# Patient Record
Sex: Female | Born: 1970
Health system: Southern US, Community
[De-identification: ages and names within clinical notes are randomized; demographics above are authoritative.]

## PROBLEM LIST (undated history)

## (undated) DIAGNOSIS — M7732 Calcaneal spur, left foot: Secondary | ICD-10-CM

## (undated) DIAGNOSIS — E7800 Pure hypercholesterolemia, unspecified: Secondary | ICD-10-CM

## (undated) DIAGNOSIS — M722 Plantar fascial fibromatosis: Secondary | ICD-10-CM

## (undated) DIAGNOSIS — M810 Age-related osteoporosis without current pathological fracture: Secondary | ICD-10-CM

## (undated) DIAGNOSIS — F259 Schizoaffective disorder, unspecified: Secondary | ICD-10-CM

## (undated) DIAGNOSIS — R739 Hyperglycemia, unspecified: Secondary | ICD-10-CM

## (undated) DIAGNOSIS — K760 Fatty (change of) liver, not elsewhere classified: Secondary | ICD-10-CM

## (undated) HISTORY — DX: Hyperglycemia, unspecified: R73.9

## (undated) HISTORY — DX: Calcaneal spur, left foot: M77.32

## (undated) HISTORY — DX: Fatty (change of) liver, not elsewhere classified: K76.0

## (undated) HISTORY — DX: Pure hypercholesterolemia, unspecified: E78.00

## (undated) HISTORY — DX: Plantar fascial fibromatosis: M72.2

## (undated) HISTORY — DX: Schizoaffective disorder, unspecified: F25.9

## (undated) HISTORY — PX: CHOLECYSTECTOMY: SHX55

---

## 2001-08-23 ENCOUNTER — Ambulatory Visit (HOSPITAL_COMMUNITY): Admission: RE | Admit: 2001-08-23 | Discharge: 2001-08-23 | Payer: Self-pay | Admitting: Family Medicine

## 2001-08-23 ENCOUNTER — Encounter: Payer: Self-pay | Admitting: Family Medicine

## 2003-11-15 ENCOUNTER — Ambulatory Visit (HOSPITAL_COMMUNITY): Admission: RE | Admit: 2003-11-15 | Discharge: 2003-11-15 | Payer: Self-pay | Admitting: Pulmonary Disease

## 2003-11-20 ENCOUNTER — Ambulatory Visit (HOSPITAL_COMMUNITY): Admission: RE | Admit: 2003-11-20 | Discharge: 2003-11-20 | Payer: Self-pay | Admitting: Pulmonary Disease

## 2003-11-30 ENCOUNTER — Ambulatory Visit (HOSPITAL_COMMUNITY): Admission: RE | Admit: 2003-11-30 | Discharge: 2003-11-30 | Payer: Self-pay | Admitting: General Surgery

## 2004-01-29 ENCOUNTER — Inpatient Hospital Stay (HOSPITAL_COMMUNITY): Admission: RE | Admit: 2004-01-29 | Discharge: 2004-02-07 | Payer: Self-pay | Admitting: Psychiatry

## 2004-10-27 ENCOUNTER — Emergency Department (HOSPITAL_COMMUNITY): Admission: EM | Admit: 2004-10-27 | Discharge: 2004-10-27 | Payer: Self-pay | Admitting: Emergency Medicine

## 2008-10-02 ENCOUNTER — Ambulatory Visit (HOSPITAL_COMMUNITY): Admission: RE | Admit: 2008-10-02 | Discharge: 2008-10-02 | Payer: Self-pay | Admitting: Obstetrics and Gynecology

## 2010-11-28 NOTE — Discharge Summary (Signed)
NAME:  Paula Oconnor, Paula Oconnor NO.:  [id number]   MEDICAL RECORD NO.:  [id number]                   PATIENT TYPE:  IPS   LOCATION:  0400                                 FACILITY:  [facility]   PHYSICIAN:  Geoffery Lyons, M.D.                   DATE OF BIRTH:  08-Sep-1970   DATE OF ADMISSION:  01/29/2004  DATE OF DISCHARGE:  02/07/2004                                 DISCHARGE SUMMARY   CHIEF COMPLAINT AND PRESENT ILLNESS:  This was the first admission to [facility] for this 40 year old married white female voluntarily  admitted.  Seen by Dr. Milford Cage for agitation and delusional thinking.  The patient believes her husband was having an affair with his ex-wife.  Has  been throwing things out into the yard, drove the husband's jeep to a pond,  making statement about husband not a true Christian, agitated and  uncooperative during the admission.  Uncooperative, easily agitated, feeling  that the Shaune Pollack was telling her that she was being persecuted.   PAST PSYCHIATRIC HISTORY:  Dr. Milford Cage.  First time inpatient.   ALCOHOL/DRUG HISTORY:  Denies the use or abuse of any substances.   PAST MEDICAL HISTORY:  Chronic cystitis, status post cholecystectomy.   MEDICATIONS:  Risperdal 1.5 mg at night (has not been taking).   PHYSICAL EXAMINATION:  Performed and failed to show any acute findings.   LABORATORY DATA:  She refused to have her blood drawn.   MENTAL STATUS EXAM:  Alert female pacing in the hall with head in her hands.  Directible with encouragement but easily agitated.  Speech some pressure.  Affect anxious, agitated.  Thought process insistent on the husband is the  problem.  Some paranoid ideation and possibly some auditory hallucinations.  Cognition well-preserved.   ADMISSION DIAGNOSES:   AXIS I:  Bipolar disorder, manic with psychotic features.   AXIS II:  No diagnosis.   AXIS III:  No diagnosis.   AXIS IV:  Moderate.   AXIS  V:  Global Assessment of Functioning upon admission 25-30; highest  Global Assessment of Functioning in the last year 65-70.   HOSPITAL COURSE:  She was admitted and started in individual and group  psychotherapy.  She, upon admission, had to be restrained.  She became very  agitated.  When she signed voluntarily, she did not want to come in.  Claiming that she was afraid for herself.  She was given Risperdal M-Tab 1  mg upon admission and M-Tab 1 mg every four hours as needed and she was  given Ativan and Cogentin.  She required Zyprexa 5 mg IM and she was given  Ambien for sleep.  We continued to work with the Risperdal.  Initially  resistant to take it.  Later on, she was compliant.  She was actively  delusional.  York Spaniel that she was placed in the hospital to get the  demons out  of her.  Stated if I don't leave, you will regret it.  Easily agitated,  not responding to attempts to bring her back to reality, demanding to be  discharged and stating that the husband was the problem.  Continued to  evidence the paranoid delusional ideas, hyperreligiousness, delusions of  being possessed, having to be cleaned by being in the hospital, the process  having been interrupted and she has to start the process all over again.  She was able to pull herself together and present herself healthier, still  claiming that the husband was having an affair.  She would not respond to  attempts to bring her back to reality with evidence that he probably was  not.  She continued to take the medication.  We went ahead and continued to  increase the Risperdal as tolerated, Risperdal M-Tab 0.5 mg in the morning  and 2.5 mg at night.  Eventually, we went up to Risperdal M-Tab 3 mg at  bedtime.  After being very agitated, very irritable and obviously anxious,  she became more subdued, withdrawn, staying in her room.  There was a family  session where the husband was trying to clarify for her and he was pretty   emotional and tearful.  She started questioning the validity of her thoughts  in terms of having an affair, so there was a turn towards her getting  better.  On February 07, 2004, she was indeed better.  There was some underlying  delusional ideas regarding the husband but she was not acting out on them  and she was also willing to give the husband the benefit of the doubt.  She  was more receptive to talk to him.  She was going to stay with her parents.  Her parents had no concerns about her being discharged or the way she was  presenting herself.  She had been compliant with medication.  There were no  suicidal ideation, no homicidal ideation and overall there was improvement.  There was no side effects to the medication and we went ahead and discharged  to outpatient follow-up.   DISCHARGE DIAGNOSES:   AXIS I:  Bipolar disorder, manic with psychotic features.   AXIS II:  No diagnosis.   AXIS III:  No diagnosis.   AXIS IV:  Moderate.   AXIS V:  Global Assessment of Functioning upon discharge 50-55.   DISCHARGE MEDICATIONS:  Risperdal M-Tab 0.5 mg in the morning and Risperdal  M-Tab 3 mg at night.   FOLLOW UP:  Dr. Milford Cage at [facility].                                               Geoffery Lyons, M.D.    IL/MEDQ  D:  03/12/2004  T:  03/13/2004  Job:  [id number]

## 2010-11-28 NOTE — Op Note (Signed)
NAME:  DAKISHA, SCHOOF                       ACCOUNT NO.:  [id number]   MEDICAL RECORD NO.:  [id number]                   PATIENT TYPE:  AMB   LOCATION:  DAY                                  FACILITY:  [facility]   PHYSICIAN:  Dalia Heading, M.D.               DATE OF BIRTH:  1970-11-16   DATE OF PROCEDURE:  DATE OF DISCHARGE:                                 OPERATIVE REPORT   PREOPERATIVE DIAGNOSIS:  Chronic cholecystitis.   POSTOPERATIVE DIAGNOSIS:  Chronic cholecystitis.   PROCEDURE:  Laparoscopic cholecystectomy   SURGEON:  Dalia Heading, M.D.   ASSISTANT:  Buena Irish, M.D.   ANESTHESIA:  General endotracheal.   INDICATIONS:  The patient is a 40 year old white female who is referred for  treatment of chronic cholecystitis.  The risks and benefits of the procedure  including bleeding, infection, hepatobiliary injury, and the possibility of  an open procedure were fully explained to the patient, who gave informed  consent.   DESCRIPTION OF PROCEDURE:  The patient was placed in the supine position.  After induction of general endotracheal anesthesia, the abdomen was prepped  and draped using the usual sterile technique with Betadine.  Surgical site  confirmation was performed.   An infraumbilical incision was made down to the fascia.  A Veress needle was  introduced into the abdominal cavity and confirmation of placement was done  using the saline drop test.  The abdomen was then insufflated to 16 mmHg  pressure.  An 11-mm trocar was introduced into the abdominal cavity under  direct visualization without difficulty.  The patient was placed in reverse  Trendelenburg position and an additional 11-mm trocar was placed in the  epigastric region and 5-mm trocars were placed in the right upper quadrant  and right flank regions. The liver was inspected and noted to be within  normal limits.   The gallbladder was retracted superiorly and laterally.  The dissection was  begun around the infundibulum of the gallbladder.  The cystic duct was first  identified.  Its juncture to the infundibulum fully identified.  Endoclips  were placed proximally and distally on the cystic duct; and the cystic duct  was divided.  This was likewise done on the cystic artery.  The gallbladder  was then freed away from the gallbladder fossa using Bovie electrocautery.  The gallbladder was delivered through the epigastric trocar site without  difficulty.  The gallbladder fossa was inspected and no abnormal bleeding or  bile leakage was noted.  All fluid and air were then evacuated from the  abdominal cavity prior to removal of the trocars.   All wounds were irrigated with normal saline.  All wounds were injected with  0.5% Sensorcaine.  The infraumbilical fascia as well as epigastric fascia  were reapproximated using an #0 Vicryl interrupted suture. All skin  incisions were closed using staples.  Betadine ointment and dry sterile  dressings were applied.  All tape and needle counts correct at the end of the procedure.  The patient  was extubated in the operating room and went back to recovery room in awake  and stable condition.   COMPLICATIONS:  None.   SPECIMENS:  Gallbladder.   BLOOD LOSS:  Minimal.      ___________________________________________                                            Dalia Heading, M.D.   MAJ/MEDQ  D:  11/30/2003  T:  11/30/2003  Job:  [id number]   cc:   Dalia Heading, M.D.  [address], [place]  [place] [address]  Fax: [phone]   Oneal Deputy. Juanetta Gosling, M.D.  [address]  [place]  [place] [address]  Fax: [phone]

## 2010-11-28 NOTE — H&P (Signed)
NAME:  Paula Oconnor, Paula Oconnor NO.:  [id number]   MEDICAL RECORD NO.:  [id number]                  PATIENT TYPE:   LOCATION:                                       FACILITY:  [facility]   PHYSICIAN:  Dalia Heading, M.D.               DATE OF BIRTH:  May 15, 1971   DATE OF ADMISSION:  DATE OF DISCHARGE:                                HISTORY & PHYSICAL   CHIEF COMPLAINT:  Chronic cholecystitis.   HISTORY OF PRESENT ILLNESS:  Patient is a 40 year old white female who is  referred for evaluation and treatment of biliary colic secondary to chronic  cholecystitis.  She has been having intermittent episodes of right upper  quadrant abdominal pain with radiation to the right flank, nausea, and  bloating for several weeks.  Some fatty food intolerance is noted.  No  fever, chills, or jaundice have been noted.  The symptoms have been  worsening.   PAST MEDICAL HISTORY:  For the most part, unremarkable.   PAST SURGICAL HISTORY:  Unremarkable.   CURRENT MEDICATIONS:  Risperdal 0.5 mg p.o. q.d.   ALLERGIES:  CODEINE.   REVIEW OF SYSTEMS:  Patient does smoke less than a pack of cigarettes a day.  She drinks alcohol on occasion.  She denies any other cardiopulmonary  difficulties or bleeding disorders.   PHYSICAL EXAMINATION:  VITAL SIGNS:  She is afebrile.  Vital signs are  stable.  GENERAL:  Patient is a well-developed and well-nourished white female in no  acute distress.  HEENT:  No scleral icterus.  LUNGS:  Clear to auscultation with equal breath sounds bilaterally.  HEART:  Regular rate and rhythm without S3, S4, or murmurs.  ABDOMEN:  Soft with slight tenderness in the right upper quadrant to  palpation.  No hepatosplenomegaly, masses, or hernias are identified.   Ultrasound of the gallbladder is negative.  Hepatobiliary scan reveals  chronic cholecystitis with a low gallbladder ejection fraction and  reproducible symptoms with a fatty meal.   IMPRESSION:   Chronic cholecystitis.   PLAN:  The patient is scheduled for a laparoscopic cholecystectomy on Nov 30, 2003.  The risks and benefits of the procedure, including bleeding,  infection, hepatobiliary injury, and the possibility of an open procedure  were fully explained to the patient, who gave informed consent.     ___________________________________________                                         Dalia Heading, M.D.   MAJ/MEDQ  D:  11/27/2003  T:  11/27/2003  Job:  [id number]   cc:   Ramon Dredge L. Juanetta Gosling, M.D.  [address]  [place]  [place] [address]  Fax: [phone]

## 2010-12-09 ENCOUNTER — Other Ambulatory Visit (HOSPITAL_COMMUNITY): Payer: Self-pay | Admitting: Pulmonary Disease

## 2010-12-09 DIAGNOSIS — R0981 Nasal congestion: Secondary | ICD-10-CM

## 2010-12-11 ENCOUNTER — Ambulatory Visit (HOSPITAL_COMMUNITY)
Admission: RE | Admit: 2010-12-11 | Discharge: 2010-12-11 | Disposition: A | Discharge status: Home / Self Care | Payer: 59 | Source: Ambulatory Visit | Attending: Pulmonary Disease | Admitting: Pulmonary Disease

## 2010-12-11 DIAGNOSIS — J3489 Other specified disorders of nose and nasal sinuses: Secondary | ICD-10-CM | POA: Insufficient documentation

## 2010-12-11 DIAGNOSIS — R0981 Nasal congestion: Secondary | ICD-10-CM

## 2010-12-11 DIAGNOSIS — J329 Chronic sinusitis, unspecified: Secondary | ICD-10-CM | POA: Insufficient documentation

## 2011-02-08 ENCOUNTER — Emergency Department (HOSPITAL_COMMUNITY): Admission: EM | Admit: 2011-02-08 | Discharge: 2011-02-08 | Discharge status: Against Medical Advice | Payer: Self-pay

## 2016-07-14 DIAGNOSIS — Z79899 Other long term (current) drug therapy: Secondary | ICD-10-CM | POA: Diagnosis not present

## 2016-08-13 DIAGNOSIS — Z79899 Other long term (current) drug therapy: Secondary | ICD-10-CM | POA: Diagnosis not present

## 2016-09-14 DIAGNOSIS — Z79899 Other long term (current) drug therapy: Secondary | ICD-10-CM | POA: Diagnosis not present

## 2016-10-13 DIAGNOSIS — Z79899 Other long term (current) drug therapy: Secondary | ICD-10-CM | POA: Diagnosis not present

## 2016-10-29 DIAGNOSIS — I259 Chronic ischemic heart disease, unspecified: Secondary | ICD-10-CM | POA: Diagnosis not present

## 2016-10-29 DIAGNOSIS — R739 Hyperglycemia, unspecified: Secondary | ICD-10-CM | POA: Diagnosis not present

## 2016-11-10 DIAGNOSIS — R739 Hyperglycemia, unspecified: Secondary | ICD-10-CM | POA: Diagnosis not present

## 2016-11-10 DIAGNOSIS — Z79899 Other long term (current) drug therapy: Secondary | ICD-10-CM | POA: Diagnosis not present

## 2016-12-09 DIAGNOSIS — Z79899 Other long term (current) drug therapy: Secondary | ICD-10-CM | POA: Diagnosis not present

## 2017-01-04 DIAGNOSIS — Z6822 Body mass index (BMI) 22.0-22.9, adult: Secondary | ICD-10-CM | POA: Diagnosis not present

## 2017-01-04 DIAGNOSIS — Z01419 Encounter for gynecological examination (general) (routine) without abnormal findings: Secondary | ICD-10-CM | POA: Diagnosis not present

## 2017-01-08 DIAGNOSIS — Z79899 Other long term (current) drug therapy: Secondary | ICD-10-CM | POA: Diagnosis not present

## 2017-02-09 DIAGNOSIS — Z79899 Other long term (current) drug therapy: Secondary | ICD-10-CM | POA: Diagnosis not present

## 2017-03-04 DIAGNOSIS — L821 Other seborrheic keratosis: Secondary | ICD-10-CM | POA: Diagnosis not present

## 2017-03-04 DIAGNOSIS — Z1283 Encounter for screening for malignant neoplasm of skin: Secondary | ICD-10-CM | POA: Diagnosis not present

## 2017-03-08 DIAGNOSIS — Z79899 Other long term (current) drug therapy: Secondary | ICD-10-CM | POA: Diagnosis not present

## 2017-04-06 DIAGNOSIS — Z79899 Other long term (current) drug therapy: Secondary | ICD-10-CM | POA: Diagnosis not present

## 2017-05-03 DIAGNOSIS — Z79899 Other long term (current) drug therapy: Secondary | ICD-10-CM | POA: Diagnosis not present

## 2017-06-04 DIAGNOSIS — Z79899 Other long term (current) drug therapy: Secondary | ICD-10-CM | POA: Diagnosis not present

## 2017-06-30 DIAGNOSIS — Z79899 Other long term (current) drug therapy: Secondary | ICD-10-CM | POA: Diagnosis not present

## 2017-07-26 DIAGNOSIS — Z79899 Other long term (current) drug therapy: Secondary | ICD-10-CM | POA: Diagnosis not present

## 2017-08-30 DIAGNOSIS — Z79899 Other long term (current) drug therapy: Secondary | ICD-10-CM | POA: Diagnosis not present

## 2017-09-24 DIAGNOSIS — Z79899 Other long term (current) drug therapy: Secondary | ICD-10-CM | POA: Diagnosis not present

## 2017-09-28 DIAGNOSIS — I259 Chronic ischemic heart disease, unspecified: Secondary | ICD-10-CM | POA: Diagnosis not present

## 2017-09-28 DIAGNOSIS — H6693 Otitis media, unspecified, bilateral: Secondary | ICD-10-CM | POA: Diagnosis not present

## 2017-10-25 DIAGNOSIS — Z79899 Other long term (current) drug therapy: Secondary | ICD-10-CM | POA: Diagnosis not present

## 2017-11-11 DIAGNOSIS — X32XXXA Exposure to sunlight, initial encounter: Secondary | ICD-10-CM | POA: Diagnosis not present

## 2017-11-11 DIAGNOSIS — L570 Actinic keratosis: Secondary | ICD-10-CM | POA: Diagnosis not present

## 2017-11-24 DIAGNOSIS — Z79899 Other long term (current) drug therapy: Secondary | ICD-10-CM | POA: Diagnosis not present

## 2017-12-22 DIAGNOSIS — Z79899 Other long term (current) drug therapy: Secondary | ICD-10-CM | POA: Diagnosis not present

## 2018-01-12 DIAGNOSIS — Z01419 Encounter for gynecological examination (general) (routine) without abnormal findings: Secondary | ICD-10-CM | POA: Diagnosis not present

## 2018-01-12 DIAGNOSIS — Z6821 Body mass index (BMI) 21.0-21.9, adult: Secondary | ICD-10-CM | POA: Diagnosis not present

## 2018-01-17 DIAGNOSIS — Z79899 Other long term (current) drug therapy: Secondary | ICD-10-CM | POA: Diagnosis not present

## 2018-02-14 DIAGNOSIS — Z79899 Other long term (current) drug therapy: Secondary | ICD-10-CM | POA: Diagnosis not present

## 2018-03-11 DIAGNOSIS — Z79899 Other long term (current) drug therapy: Secondary | ICD-10-CM | POA: Diagnosis not present

## 2018-04-11 DIAGNOSIS — Z79899 Other long term (current) drug therapy: Secondary | ICD-10-CM | POA: Diagnosis not present

## 2018-05-11 DIAGNOSIS — Z79899 Other long term (current) drug therapy: Secondary | ICD-10-CM | POA: Diagnosis not present

## 2018-06-03 DIAGNOSIS — Z79899 Other long term (current) drug therapy: Secondary | ICD-10-CM | POA: Diagnosis not present

## 2018-06-30 DIAGNOSIS — Z79899 Other long term (current) drug therapy: Secondary | ICD-10-CM | POA: Diagnosis not present

## 2018-07-27 DIAGNOSIS — Z79899 Other long term (current) drug therapy: Secondary | ICD-10-CM | POA: Diagnosis not present

## 2018-08-22 DIAGNOSIS — Z79899 Other long term (current) drug therapy: Secondary | ICD-10-CM | POA: Diagnosis not present

## 2018-09-14 DIAGNOSIS — Z79899 Other long term (current) drug therapy: Secondary | ICD-10-CM | POA: Diagnosis not present

## 2018-10-06 DIAGNOSIS — D649 Anemia, unspecified: Secondary | ICD-10-CM | POA: Diagnosis not present

## 2018-10-06 DIAGNOSIS — R739 Hyperglycemia, unspecified: Secondary | ICD-10-CM | POA: Diagnosis not present

## 2018-10-11 DIAGNOSIS — R739 Hyperglycemia, unspecified: Secondary | ICD-10-CM | POA: Diagnosis not present

## 2018-10-11 DIAGNOSIS — Z79899 Other long term (current) drug therapy: Secondary | ICD-10-CM | POA: Diagnosis not present

## 2018-10-11 LAB — HEMOGLOBIN A1C: Hemoglobin A1C: 5.1

## 2018-10-11 LAB — CBC AND DIFFERENTIAL
Hemoglobin: 13.2 (ref 12.0–16.0)
WBC: 10.5

## 2018-11-09 DIAGNOSIS — I10 Essential (primary) hypertension: Secondary | ICD-10-CM | POA: Diagnosis not present

## 2018-11-21 ENCOUNTER — Encounter: Payer: Self-pay | Admitting: "Endocrinology

## 2018-11-21 ENCOUNTER — Encounter (INDEPENDENT_AMBULATORY_CARE_PROVIDER_SITE_OTHER): Payer: Self-pay

## 2018-11-21 ENCOUNTER — Ambulatory Visit (INDEPENDENT_AMBULATORY_CARE_PROVIDER_SITE_OTHER): Payer: 59 | Admitting: "Endocrinology

## 2018-11-21 ENCOUNTER — Other Ambulatory Visit: Payer: Self-pay

## 2018-11-21 VITALS — BP 127/83 | HR 116 | Ht 62.5 in | Wt 138.0 lb

## 2018-11-21 DIAGNOSIS — R635 Abnormal weight gain: Secondary | ICD-10-CM | POA: Diagnosis not present

## 2018-11-21 DIAGNOSIS — E349 Endocrine disorder, unspecified: Secondary | ICD-10-CM | POA: Diagnosis not present

## 2018-11-21 DIAGNOSIS — R7302 Impaired glucose tolerance (oral): Secondary | ICD-10-CM | POA: Insufficient documentation

## 2018-11-21 NOTE — Progress Notes (Signed)
Endocrinology Consult Note                                            11/21/2018, 5:38 PM   Subjective:    Patient ID: Paula Oconnor, female    DOB: [birth year], PCP Kari Baars, MD   Past Medical History:  Diagnosis Date  . Hyperglycemia   . Schizoaffective disorder [facility])    Past Surgical History:  Procedure Laterality Date  . CHOLECYSTECTOMY     Social History   Socioeconomic History  . Marital status: Married    Spouse name: Not on file  . Number of children: Not on file  . Years of education: Not on file  . Highest education level: Not on file  Occupational History  . Not on file  Social Needs  . Financial resource strain: Not on file  . Food insecurity:    Worry: Not on file    Inability: Not on file  . Transportation needs:    Medical: Not on file    Non-medical: Not on file  Tobacco Use  . Smoking status: Former Games developer  . Smokeless tobacco: Never Used  Substance and Sexual Activity  . Alcohol use: Never    Frequency: Never  . Drug use: Never  . Sexual activity: Not on file  Lifestyle  . Physical activity:    Days per week: Not on file    Minutes per session: Not on file  . Stress: Not on file  Relationships  . Social connections:    Talks on phone: Not on file    Gets together: Not on file    Attends religious service: Not on file    Active member of club or organization: Not on file    Attends meetings of clubs or organizations: Not on file    Relationship status: Not on file  Other Topics Concern  . Not on file  Social History Narrative  . Not on file   History reviewed. No pertinent family history. Outpatient Encounter Medications as of 11/21/2018  Medication Sig  . Multiple Vitamin (MULTIVITAMIN) tablet Take 1 tablet by mouth daily.  . risperiDONE (RISPERDAL) 0.5 MG tablet Take 0.5 mg by mouth at bedtime.  . clonazepam (KLONOPIN WAFER) 0.125 MG disintegrating tablet Take 0.125 mg by mouth 2 (two) times daily.    .  cloZAPine (CLOZARIL) 100 MG tablet Take 600 mg by mouth 3 (three) times daily. Take 1 tab in the morning, 1 in the afternoon, and 5 before bedtime.   . ferrous sulfate (IRON SUPPLEMENT) 325 (65 FE) MG tablet Take 325 mg by mouth daily with breakfast.    . [DISCONTINUED] docusate sodium (COLACE) 100 MG capsule Take 100 mg by mouth 2 (two) times daily.    . [DISCONTINUED] folic acid (FOLVITE) 1 MG tablet Take 2 mg by mouth 2 (two) times daily.    . [DISCONTINUED] ibuprofen (ADVIL,MOTRIN) 200 MG tablet Take 200 mg by mouth every 6 (six) hours as needed. Pain   . [DISCONTINUED] lamoTRIgine (LAMICTAL) 25 MG tablet Take 25 mg by mouth daily.    . [DISCONTINUED] topiramate (TOPAMAX) 100 MG tablet Take 100 mg by mouth daily.     No facility-administered encounter medications on file as of 11/21/2018.    ALLERGIES: Allergies  Allergen Reactions  . Codeine Other (See Comments)    Patient doesn't  remember what happened    VACCINATION STATUS:  There is no immunization history on file for this patient.  HPI Paula Oconnor is 48 y.o. female who presents today with a medical history as above. she is being seen in consultation for hyperglycemia requested by Kari BaarsHawkins, Edward, MD.   -Patient denies any prior history of diabetes nor prediabetes.  Her most recent A1c was 5.1%, 2 years prior it was 4.8%.  She has been measuring her glycemic profile intermittently and randomly. She denies to have blood glucose profile below 70 nor above 200.  However she reports that she feels shakes, palpitations, and diaphoresis intermittently which forces her to eat every 2-3 hours.  That is causing unintended weight gain.  Her weight is still near her ideal weight around 138 pounds with BMI of 24.  She is on a list of medications related to her mood disorder including clozapine, clonazepam, Klonopin, risperidone.  She has history of anemia in the past however her most recent CBC showed hemoglobin of 13.6.  She is adopted,  does not know any family history of diabetes.  She denies alcohol abuse, she denies any prior exposure to steroids.   Review of Systems  Constitutional: + recent weight gain, no fatigue, no subjective hyperthermia, no subjective hypothermia Eyes: no blurry vision, no xerophthalmia ENT: no sore throat, no nodules palpated in throat, no dysphagia/odynophagia, no hoarseness Cardiovascular: no Chest Pain, no Shortness of Breath, no palpitations, no leg swelling Respiratory: no cough, no shortness of breath Gastrointestinal: no Nausea/Vomiting/Diarhhea Musculoskeletal: no muscle/joint aches Skin: no rashes Neurological: + tremors, no numbness, no tingling, + dizziness Psychiatric: + depression, + anxiety  Objective:    BP 127/83   Pulse (!) 116   Ht 5' 2.5" (1.588 m)   Wt 138 lb (62.6 kg)   BMI 24.84 kg/m   Wt Readings from Last 3 Encounters:  11/21/18 138 lb (62.6 kg)    Physical Exam  Constitutional:  + Appropriate weight for height, not in acute distress, normal state of mind Eyes: PERRLA, EOMI, no exophthalmos ENT: moist mucous membranes, no gross thyromegaly, no gross cervical lymphadenopathy Cardiovascular: normal precordial activity, Regular Rate and Rhythm, no Murmur/Rubs/Gallops Respiratory:  adequate breathing efforts, no gross chest deformity, Clear to auscultation bilaterally Gastrointestinal: abdomen soft, Non -tender, No distension, Bowel Sounds present, no gross organomegaly Musculoskeletal: no gross deformities, strength intact in all four extremities Skin: moist, warm, no rashes Neurological: no tremor with outstretched hands, Deep tendon reflexes normal in bilateral lower extremities.    Assessment & Plan:   1. Endocrine disorder, unspecified 2. Weight gain  - Paula Oconnor  is being seen at a kind request of Kari BaarsHawkins, Edward, MD. - I have reviewed her available endocrine records and clinically evaluated the patient.  Based on these reviews, patient does  not have diabetes nor prediabetes.  She presents with symptoms suggestive of reactive hypoglycemia.  The possibility of hyperglycemia associated with risperidone is discussed with her.  However this would not be clinically significant in her case and I did not suggest for her to discontinue this medication.  She will greatly benefit from dietary modification which I discussed in detail with her.  - Patient admits there is a room for improvement in her diet and drink choices. -  Suggestion is made for her to avoid simple carbohydrates  from her diet including Cakes, Sweet Desserts / Pastries, Ice Cream, Soda (diet and regular), Sweet Tea, Candies, Chips, Cookies, Store Bought Juices, Alcohol in Excess  of  1-2 drinks a day, Artificial Sweeteners, and "Sugar-free" Products. This will help patient to have stable blood glucose profile and potentially avoid unintended weight gain.  -She will benefit from work-up for adrenal insufficiency, thyroid sufficiency, vitamin D level measurement.  She is approached to monitor blood glucose when she feels symptoms including tremors, diaphoresis, headaches before she intervenes. She is encouraged to call clinic if she registers less than 70 mg/dl  Or greater than 004 mg per DL x3 in a week.  -Based on her presentation so far, there is not sufficient information to proceed with definitive treatment plan.   - I did not initiate any new prescriptions today.  Her major manifestation seems to be her schizoaffective disorder on polypharmacy, may benefit from evaluation by a psychiatrist.  - I advised her  to maintain close follow up with Kari Baars, MD for primary care needs.   - Time spent with the patient: 45 minutes, of which >50% was spent in obtaining information about her symptoms, reviewing her previous labs/studies,  evaluations, and treatments, counseling her about her hyperglycemia, weight gain,, and developing a plan to confirm the diagnosis and long term  treatment based on the latest standards of care/guidelines.    Paula Oconnor participated in the discussions, expressed understanding, and voiced agreement with the above plans.  All questions were answered to her satisfaction. she is encouraged to contact clinic should she have any questions or concerns prior to her return visit.  Follow up plan: Return in about 10 days (around 12/01/2018) for Follow up with Pre-visit Labs.   Marquis Lunch, MD [facility] [address] [place], [place] [address] Phone: [phone]  Fax: [phone]     11/21/2018, 5:38 PM  This note was partially dictated with voice recognition software. Similar sounding words can be transcribed inadequately or may not  be corrected upon review.

## 2018-11-25 DIAGNOSIS — R635 Abnormal weight gain: Secondary | ICD-10-CM | POA: Diagnosis not present

## 2018-11-25 DIAGNOSIS — E349 Endocrine disorder, unspecified: Secondary | ICD-10-CM | POA: Diagnosis not present

## 2018-11-28 LAB — CORTISOL-AM, BLOOD: Cortisol - AM: 21.1 ug/dL

## 2018-11-28 LAB — VITAMIN D 25 HYDROXY (VIT D DEFICIENCY, FRACTURES): Vit D, 25-Hydroxy: 20 ng/mL — ABNORMAL LOW (ref 30–100)

## 2018-11-28 LAB — T3, FREE: T3, Free: 2.5 pg/mL (ref 2.3–4.2)

## 2018-11-28 LAB — T4, FREE: Free T4: 1.1 ng/dL (ref 0.8–1.8)

## 2018-11-28 LAB — TSH: TSH: 2.27 mIU/L

## 2018-11-30 ENCOUNTER — Other Ambulatory Visit: Payer: Self-pay

## 2018-11-30 ENCOUNTER — Ambulatory Visit (INDEPENDENT_AMBULATORY_CARE_PROVIDER_SITE_OTHER): Payer: 59 | Admitting: "Endocrinology

## 2018-11-30 ENCOUNTER — Encounter: Payer: Self-pay | Admitting: "Endocrinology

## 2018-11-30 DIAGNOSIS — R739 Hyperglycemia, unspecified: Secondary | ICD-10-CM | POA: Diagnosis not present

## 2018-11-30 DIAGNOSIS — E559 Vitamin D deficiency, unspecified: Secondary | ICD-10-CM

## 2018-11-30 DIAGNOSIS — R635 Abnormal weight gain: Secondary | ICD-10-CM

## 2018-11-30 MED ORDER — VITAMIN D3 125 MCG (5000 UT) PO CAPS
5000.0000 [IU] | ORAL_CAPSULE | Freq: Every day | ORAL | 0 refills | Status: DC
Start: 2018-11-30 — End: 2021-02-26

## 2018-11-30 NOTE — Progress Notes (Signed)
11/30/2018, 5:36 PM                                Endocrinology Telehealth Visit Follow up Note -During COVID -19 Pandemic  I connected with Paula Oconnor on 11/30/2018   by telephone and verified that I am speaking with the correct person using two identifiers. Paula Oconnor, 25-Mar-1971. she has verbally consented to this visit. All issues noted in this document were discussed and addressed. The format was not optimal for physical exam.     Subjective:    Patient ID: Paula Oconnor, female    DOB: Feb 28, 1971, PCP Kari Baars, MD   Past Medical History:  Diagnosis Date  . Hyperglycemia   . Schizoaffective disorder [facility])    Past Surgical History:  Procedure Laterality Date  . CHOLECYSTECTOMY     Social History   Socioeconomic History  . Marital status: Married    Spouse name: Not on file  . Number of children: Not on file  . Years of education: Not on file  . Highest education level: Not on file  Occupational History  . Not on file  Social Needs  . Financial resource strain: Not on file  . Food insecurity:    Worry: Not on file    Inability: Not on file  . Transportation needs:    Medical: Not on file    Non-medical: Not on file  Tobacco Use  . Smoking status: Former Games developer  . Smokeless tobacco: Never Used  Substance and Sexual Activity  . Alcohol use: Never    Frequency: Never  . Drug use: Never  . Sexual activity: Not on file  Lifestyle  . Physical activity:    Days per week: Not on file    Minutes per session: Not on file  . Stress: Not on file  Relationships  . Social connections:    Talks on phone: Not on file    Gets together: Not on file    Attends religious service: Not on file    Active member of club or organization: Not on file    Attends meetings of clubs or organizations: Not on file    Relationship status: Not on file  Other Topics Concern  . Not on file  Social History  Narrative  . Not on file   History reviewed. No pertinent family history. Outpatient Encounter Medications as of 11/30/2018  Medication Sig  . Cholecalciferol (VITAMIN D3) 125 MCG (5000 UT) CAPS Take 1 capsule (5,000 Units total) by mouth daily.  . clonazepam (KLONOPIN WAFER) 0.125 MG disintegrating tablet Take 0.125 mg by mouth 2 (two) times daily.    . cloZAPine (CLOZARIL) 100 MG tablet Take 600 mg by mouth 3 (three) times daily. Take 1 tab in the morning, 1 in the afternoon, and 5 before bedtime.   . ferrous sulfate (IRON SUPPLEMENT) 325 (65 FE) MG tablet Take 325 mg by mouth daily with breakfast.    . Multiple Vitamin (MULTIVITAMIN) tablet Take 1 tablet by mouth daily.  . risperiDONE (RISPERDAL) 0.5 MG tablet Take 0.5 mg by mouth at bedtime.   No facility-administered encounter medications on file  as of 11/30/2018.    ALLERGIES: Allergies  Allergen Reactions  . Codeine Other (See Comments)    Patient doesn't remember what happened    VACCINATION STATUS:  There is no immunization history on file for this patient.  HPI Paula Oconnor is 48 y.o. female who was seen last visit in consultation for hyperglycemia   requested by Kari BaarsHawkins, Edward, MD.   -Patient denies any prior history of diabetes nor prediabetes.  Her most recent A1c was 5.1%, 2 years prior it was 4.8%.  She has been measuring her glycemic profile intermittently and randomly. She denies to have blood glucose profile below 70 nor above 200.  She has not made significant change in her diet, continues to have some shakes and palpitations intermittently which forces her to snack every 2-3 hours.  -Her previsit labs rule out adrenal insufficiency, thyroid dysfunction, confirm vitamin D deficiency.  -She has multiple complaints including weight gain, her weight is still near her ideal weight around 138 pounds with BMI of 24.  She is on a list of medications related to her mood disorder including clozapine, clonazepam,  Klonopin, risperidone.  She has history of anemia in the past however her most recent CBC showed hemoglobin of 13.6.  She is adopted, does not know any family history of diabetes.  She denies alcohol abuse, she denies any prior exposure to steroids.   Review of Systems Limited as above.    Objective:    There were no vitals taken for this visit.  Wt Readings from Last 3 Encounters:  11/21/18 138 lb (62.6 kg)    Physical Exam  Recent Results (from the past 2160 hour(s))  CBC and differential     Status: None   Collection Time: 10/11/18 12:00 AM  Result Value Ref Range   Hemoglobin 13.2 12.0 - 16.0   WBC 10.5   Hemoglobin A1c     Status: None   Collection Time: 10/11/18 12:00 AM  Result Value Ref Range   Hemoglobin A1C 5.1   Cortisol-am, blood     Status: None   Collection Time: 11/25/18  7:11 AM  Result Value Ref Range   Cortisol - AM 21.1 mcg/dL    Comment: Reference Range 8 a.m. (7-9 a.m.) Specimen: 4.0-22.0 .   TSH     Status: None   Collection Time: 11/25/18  7:11 AM  Result Value Ref Range   TSH 2.27 mIU/L    Comment:           Reference Range .           > or = 20 Years  0.40-4.50 .                Pregnancy Ranges           First trimester    0.26-2.66           Second trimester   0.55-2.73           Third trimester    0.43-2.91   T4, free     Status: None   Collection Time: 11/25/18  7:11 AM  Result Value Ref Range   Free T4 1.1 0.8 - 1.8 ng/dL  T3, free     Status: None   Collection Time: 11/25/18  7:11 AM  Result Value Ref Range   T3, Free 2.5 2.3 - 4.2 pg/mL  VITAMIN D 25 Hydroxy (Vit-D Deficiency, Fractures)     Status: Abnormal   Collection Time: 11/25/18  7:11 AM  Result Value Ref Range   Vit D, 25-Hydroxy 20 (L) 30 - 100 ng/mL    Comment: Vitamin D Status         25-OH Vitamin D: . Deficiency:                    <20 ng/mL Insufficiency:             20 - 29 ng/mL Optimal:                 > or = 30 ng/mL . For 25-OH Vitamin D testing on  patients on  D2-supplementation and patients for whom quantitation  of D2 and D3 fractions is required, the [facility]ure[facility](TM) 25-OH VIT D, (D2,D3), LC/MS/MS is recommended: order  code 40981 (patients >70yrs). See Note 1 . Note 1 . For additional information, please refer to  http://education.[facility].com/faq/FAQ199  (This link is being provided for informational/ educational purposes only.)       Assessment & Plan:   1. Endocrine disorder, unspecified 2. Weight gain  -She is reassured that her adrenal function is normal as well as her thyroid function.  She does not have diabetes nor prediabetes at this time.  She will be initiated on vitamin D supplements. -She appears to have random hyperglycemia related to her Risperdal, however she will continue to benefit from this medication and I do not suggest for her to discontinue this medication without discussion with her psychiatrist.  She will greatly benefit from dietary modification which I discussed in detail with her, is hesitant to change.  - Patient admits there is a room for improvement in her diet and drink choices. -  Suggestion is made for her to avoid simple carbohydrates  from her diet including Cakes, Sweet Desserts / Pastries, Ice Cream, Soda (diet and regular), Sweet Tea, Candies, Chips, Cookies, Store Bought Juices, Alcohol in Excess of  1-2 drinks a day, Artificial Sweeteners, and "Sugar-free" Products. This will help patient to have stable blood glucose profile and potentially avoid unintended weight gain. She is advised to continue to monitor blood glucose especially when she feels shakes and tremors before she intervenes and advised to call clinic her meter registers blood glucose readings less than 70 or greater than 2003.    - Her major manifestation seems to be her schizoaffective disorder on polypharmacy, may benefit from evaluation by a psychiatrist.  - I advised her  to maintain close follow up with  Kari Baars, MD for primary care needs.  Time for this visit 15 minutes. Paula Oconnor participated in the discussions, expressed understanding, and voiced agreement with the above plans.  All questions were answered to her satisfaction. she is encouraged to contact clinic should she have any questions or concerns prior to her return visit.  Follow up plan: Return in about 1 year (around 11/30/2019) for Follow up with Pre-visit Labs.   Marquis Lunch, MD [facility] [address] [place], [place] [address] Phone: [phone]  Fax: [phone]     11/30/2018, 5:36 PM  This note was partially dictated with voice recognition software. Similar sounding words can be transcribed inadequately or may not  be corrected upon review.

## 2018-12-21 ENCOUNTER — Ambulatory Visit: Payer: Self-pay | Admitting: "Endocrinology

## 2019-11-02 LAB — VITAMIN D 25 HYDROXY (VIT D DEFICIENCY, FRACTURES): Vit D, 25-Hydroxy: 41.8

## 2019-11-02 LAB — LIPID PANEL
Cholesterol: 203 — AB (ref 0–200)
HDL: 83 — AB (ref 35–70)
LDL Cholesterol: 107
Triglycerides: 72 (ref 40–160)

## 2019-11-02 LAB — TSH: TSH: 1.73 (ref 0.41–5.90)

## 2019-11-02 LAB — BASIC METABOLIC PANEL
BUN: 15 (ref 4–21)
Creatinine: 1.1 (ref 0.5–1.1)

## 2019-11-02 LAB — VITAMIN B12: Vitamin B-12: 1031

## 2019-11-30 ENCOUNTER — Other Ambulatory Visit: Payer: Self-pay

## 2019-11-30 ENCOUNTER — Ambulatory Visit (INDEPENDENT_AMBULATORY_CARE_PROVIDER_SITE_OTHER): Payer: 59 | Admitting: "Endocrinology

## 2019-11-30 ENCOUNTER — Encounter: Payer: Self-pay | Admitting: "Endocrinology

## 2019-11-30 VITALS — BP 111/76 | HR 102 | Ht 63.0 in | Wt 138.8 lb

## 2019-11-30 DIAGNOSIS — E559 Vitamin D deficiency, unspecified: Secondary | ICD-10-CM

## 2019-11-30 DIAGNOSIS — E782 Mixed hyperlipidemia: Secondary | ICD-10-CM | POA: Insufficient documentation

## 2019-11-30 NOTE — Progress Notes (Signed)
11/30/2019, 6:26 PM                   Endocrinology follow-up note  Subjective:    Patient ID: Paula Oconnor, female    DOB: [birth year], PCP Kari Baars, MD   Past Medical History:  Diagnosis Date  . Hyperglycemia   . Schizoaffective disorder [facility])    Past Surgical History:  Procedure Laterality Date  . CHOLECYSTECTOMY     Social History   Socioeconomic History  . Marital status: Married    Spouse name: Not on file  . Number of children: Not on file  . Years of education: Not on file  . Highest education level: Not on file  Occupational History  . Not on file  Tobacco Use  . Smoking status: Former Games developer  . Smokeless tobacco: Never Used  Substance and Sexual Activity  . Alcohol use: Never  . Drug use: Never  . Sexual activity: Not on file  Other Topics Concern  . Not on file  Social History Narrative  . Not on file   Social Determinants of Health   Financial Resource Strain:   . Difficulty of Paying Living Expenses:   Food Insecurity:   . Worried About Programme researcher, broadcasting/film/video in the Last Year:   . Barista in the Last Year:   Transportation Needs:   . Freight forwarder (Medical):   Marland Kitchen Lack of Transportation (Non-Medical):   Physical Activity:   . Days of Exercise per Week:   . Minutes of Exercise per Session:   Stress:   . Feeling of Stress :   Social Connections:   . Frequency of Communication with Friends and Family:   . Frequency of Social Gatherings with Friends and Family:   . Attends Religious Services:   . Active Member of Clubs or Organizations:   . Attends Banker Meetings:   Marland Kitchen Marital Status:    History reviewed. No pertinent family history. Outpatient Encounter Medications as of 11/30/2019  Medication Sig  . Cholecalciferol (VITAMIN D3) 125 MCG (5000 UT) CAPS Take 1 capsule (5,000 Units total) by mouth daily.  . citalopram (CELEXA) 20 MG tablet Take 20 mg by  mouth 2 (two) times daily.  . clonazepam (KLONOPIN WAFER) 0.125 MG disintegrating tablet Take 0.125 mg by mouth 2 (two) times daily.    . cloZAPine (CLOZARIL) 100 MG tablet Take 600 mg by mouth 3 (three) times daily. Take 1 tab in the morning, 1 in the afternoon, and 5 before bedtime.   . ferrous sulfate (IRON SUPPLEMENT) 325 (65 FE) MG tablet Take 325 mg by mouth daily with breakfast.    . Multiple Vitamin (MULTIVITAMIN) tablet Take 1 tablet by mouth daily.  . risperiDONE (RISPERDAL) 0.5 MG tablet Take 0.5 mg by mouth at bedtime.   No facility-administered encounter medications on file as of 11/30/2019.   ALLERGIES: Allergies  Allergen Reactions  . Codeine Other (See Comments)    Patient doesn't remember what happened    VACCINATION STATUS:  There is no immunization history on file for this patient.  HPI Paula Oconnor is 49 y.o. female who was seen last visit in consultation for hyperglycemia  requested by Kari Baars, MD.   -Patient denies any prior history of diabetes nor prediabetes.  Her recent A1c is the same as last visit A1c of 5.1%.  2 years prior was 4.8%.    She has been measuring her glycemic profile intermittently and randomly, no hyper or hypoglycemia. She denies to have blood glucose profile below 70 nor above 200.  She has no new complaints today.    -Her previsit labs ruled out adrenal insufficiency, thyroid dysfunction.  Her vitamin D level is replete now.  her weight is still near her ideal weight around 138 pounds with BMI of 24.  She is on a list of medications related to her mood disorder including clozapine, clonazepam, Klonopin, risperidone.  She has history of anemia in the past however her most recent CBC showed hemoglobin of 13.6.  She is adopted, does not know any family history of diabetes.  She denies alcohol abuse, she denies any prior exposure to steroids.   Review of Systems Limited as above.    Objective:    BP 111/76   Pulse (!) 102    Ht 5\' 3"  (1.6 m)   Wt 138 lb 12.8 oz (63 kg)   BMI 24.59 kg/m   Wt Readings from Last 3 Encounters:  11/30/19 138 lb 12.8 oz (63 kg)  11/21/18 138 lb (62.6 kg)    Physical Exam  Recent Results (from the past 2160 hour(s))  VITAMIN D 25 Hydroxy (Vit-D Deficiency, Fractures)     Status: None   Collection Time: 11/02/19 12:00 AM  Result Value Ref Range   Vit D, 25-Hydroxy 41.8   Basic metabolic panel     Status: None   Collection Time: 11/02/19 12:00 AM  Result Value Ref Range   BUN 15 4 - 21   Creatinine 1.1 0.5 - 1.1  Lipid panel     Status: Abnormal   Collection Time: 11/02/19 12:00 AM  Result Value Ref Range   Triglycerides 72 40 - 160   Cholesterol 203 (A) 0 - 200   HDL 83 (A) 35 - 70   LDL Cholesterol 107   Vitamin B12     Status: None   Collection Time: 11/02/19 12:00 AM  Result Value Ref Range   Vitamin B-12 1,031   TSH     Status: None   Collection Time: 11/02/19 12:00 AM  Result Value Ref Range   TSH 1.73 0.41 - 5.90      Assessment & Plan:   1.  Vitamin D deficiency--replete  2.  Dyslipidemia  -She is reassured that her adrenal function is normal as well as her thyroid function.  Confirmed by her repeat previsit labs, she did not have prediabetes/diabetes, thyroid dysfunction.   -She did not have any further glycemic excursions. -She previously had  random hyperglycemia related to her Risperdal, however she will continue to benefit from this medication and I do not suggest for her to discontinue this medication without discussion with her psychiatrist.  She will greatly benefit from dietary modification which I discussed in detail with her, is hesitant to change.  -Screening for celiac disease is negative.  - Patient admits there is a room for improvement in her diet and drink choices. -  Suggestion is made for her to avoid simple carbohydrates  from her diet including Cakes, Sweet Desserts / Pastries, Ice Cream, Soda (diet and regular), Sweet Tea,  Candies, Chips, Cookies, Store Bought Juices, Alcohol in Excess of  1-2 drinks a day,  Artificial Sweeteners, and "Sugar-free" Products. This will help patient to have stable blood glucose profile and potentially avoid unintended weight gain. She is advised to continue to monitor blood glucose especially when she feels shakes and tremors before she intervenes and advised to call clinic her meter registers blood glucose readings less than 70 or greater than 2003.    -She is advised on avoiding butter and fried food to help her dyslipidemia.  She will not need to start intervention at this time.  She is advised to continue her vitamin D supplements.  - Her major manifestation seems to be her schizoaffective disorder on polypharmacy, may benefit from evaluation by a psychiatrist.   - I advised her  to maintain close follow up with Sinda Du, MD for primary care needs.     - Time spent on this patient care encounter:  20 minutes of which 50% was spent in  counseling and the rest reviewing  her current and  previous labs / studies and medications  doses and developing a plan for long term care. Jilda Roche  participated in the discussions, expressed understanding, and voiced agreement with the above plans.  All questions were answered to her satisfaction. she is encouraged to contact clinic should she have any questions or concerns prior to her return visit.  Follow up plan: Return in about 6 months (around 06/01/2020) for F/U with Pre-visit Labs.   Glade Lloyd, MD [facility] [address] [place],  [address] Phone: [phone]  Fax: [phone]     11/30/2019, 6:26 PM  This note was partially dictated with voice recognition software. Similar sounding words can be transcribed inadequately or may not  be corrected upon review.

## 2020-02-14 ENCOUNTER — Other Ambulatory Visit: Payer: Self-pay

## 2020-02-14 ENCOUNTER — Ambulatory Visit (INDEPENDENT_AMBULATORY_CARE_PROVIDER_SITE_OTHER): Payer: 59

## 2020-02-14 ENCOUNTER — Ambulatory Visit
Admission: EM | Admit: 2020-02-14 | Discharge: 2020-02-14 | Disposition: A | Discharge status: Home / Self Care | Payer: Medicare Other | Attending: Emergency Medicine | Admitting: Emergency Medicine

## 2020-02-14 DIAGNOSIS — S99921A Unspecified injury of right foot, initial encounter: Secondary | ICD-10-CM

## 2020-02-14 DIAGNOSIS — M79671 Pain in right foot: Secondary | ICD-10-CM | POA: Diagnosis not present

## 2020-02-14 NOTE — ED Provider Notes (Signed)
[facility]   [id number] 02/14/20 Arrival Time: 1547   Chief Complaint  Patient presents with  . Fall     SUBJECTIVE: History from: patient.  Paula Oconnor is a 49 y.o. female who presented to the urgent care with a complaint of right foot pain and injury that occurred 2 to 3 hours ago.  Reports she fell from one step.  Localized pain to the right foot.  She describes the pain as constant and achy.  Has not tried any OTC medication.  Her symptoms are made worse with ROM.  She denies similar symptoms in the past.  Denies chills, fever, nausea, vomiting, diarrhea.  ROS: As per HPI.  All other pertinent ROS negative.     Past Medical History:  Diagnosis Date  . Hyperglycemia   . Schizoaffective disorder [facility])    Past Surgical History:  Procedure Laterality Date  . CHOLECYSTECTOMY     Allergies  Allergen Reactions  . Codeine Other (See Comments)    Patient doesn't remember what happened   No current facility-administered medications on file prior to encounter.   Current Outpatient Medications on File Prior to Encounter  Medication Sig Dispense Refill  . Cholecalciferol (VITAMIN D3) 125 MCG (5000 UT) CAPS Take 1 capsule (5,000 Units total) by mouth daily. 90 capsule 0  . citalopram (CELEXA) 20 MG tablet Take 20 mg by mouth 2 (two) times daily.    . clonazepam (KLONOPIN WAFER) 0.125 MG disintegrating tablet Take 0.125 mg by mouth 2 (two) times daily.      . cloZAPine (CLOZARIL) 100 MG tablet Take 600 mg by mouth 3 (three) times daily. Take 1 tab in the morning, 1 in the afternoon, and 5 before bedtime.     . ferrous sulfate (IRON SUPPLEMENT) 325 (65 FE) MG tablet Take 325 mg by mouth daily with breakfast.      . Multiple Vitamin (MULTIVITAMIN) tablet Take 1 tablet by mouth daily.    . risperiDONE (RISPERDAL) 0.5 MG tablet Take 0.5 mg by mouth at bedtime.     Social History   Socioeconomic History  . Marital status: Married    Spouse name: Not on file  .  Number of children: Not on file  . Years of education: Not on file  . Highest education level: Not on file  Occupational History  . Not on file  Tobacco Use  . Smoking status: Former Games developer  . Smokeless tobacco: Never Used  Substance and Sexual Activity  . Alcohol use: Never  . Drug use: Never  . Sexual activity: Not on file  Other Topics Concern  . Not on file  Social History Narrative  . Not on file   Social Determinants of Health   Financial Resource Strain:   . Difficulty of Paying Living Expenses:   Food Insecurity:   . Worried About Programme researcher, broadcasting/film/video in the Last Year:   . Barista in the Last Year:   Transportation Needs:   . Freight forwarder (Medical):   Marland Kitchen Lack of Transportation (Non-Medical):   Physical Activity:   . Days of Exercise per Week:   . Minutes of Exercise per Session:   Stress:   . Feeling of Stress :   Social Connections:   . Frequency of Communication with Friends and Family:   . Frequency of Social Gatherings with Friends and Family:   . Attends Religious Services:   . Active Member of Clubs or Organizations:   . Attends  Club or Organization Meetings:   Marland Kitchen Marital Status:   Intimate Partner Violence:   . Fear of Current or Ex-Partner:   . Emotionally Abused:   Marland Kitchen Physically Abused:   . Sexually Abused:    Family History  Family history unknown: Yes    OBJECTIVE:  Vitals:   02/14/20 1604  BP: 119/79  Pulse: (!) 102  Resp: 16  Temp: 97.7 F (36.5 C)  TempSrc: Oral  SpO2: 98%     Physical Exam Vitals and nursing note reviewed.  Constitutional:      General: She is not in acute distress.    Appearance: Normal appearance. She is normal weight. She is not ill-appearing, toxic-appearing or diaphoretic.  Cardiovascular:     Rate and Rhythm: Normal rate and regular rhythm.     Pulses: Normal pulses.     Heart sounds: Normal heart sounds. No murmur heard.  No friction rub. No gallop.   Pulmonary:     Effort: Pulmonary  effort is normal. No respiratory distress.     Breath sounds: Normal breath sounds. No stridor. No wheezing, rhonchi or rales.  Chest:     Chest wall: No tenderness.  Musculoskeletal:        General: Swelling and tenderness present.     Right foot: Swelling and tenderness present.     Left foot: Normal.     Comments: Patient is able to bear weight and ambulate with pain.  No surface trauma, ecchymosis, erythema, lesion, ulcer or break in skin integrity.  The right foot is with obvious deformity when compared to the left foot.  Swelling present.  Tenderness to midfoot.  Normal plantar dorsiflexion, inversion/eversion with pain.  Neurovascular status intact.  Neurological:     Mental Status: She is alert.     LABS:  DG FOOT COMPLETE RIGHT   DG Foot Complete Right  Result Date: 02/14/2020 CLINICAL DATA:  Twisting injury with lateral pain and bruising. EXAM: RIGHT FOOT COMPLETE - 3+ VIEW COMPARISON:  None. FINDINGS: There is no evidence of fracture or dislocation. There is no evidence of arthropathy or other focal bone abnormality. Soft tissues are unremarkable. IMPRESSION: Negative. Electronically Signed   By: Paulina Fusi M.D.   On: 02/14/2020 16:25    No results found for this or any previous visit (from the past 24 hour(s)).   ASSESSMENT & PLAN:  1. Right foot pain   2. Injury of right foot, initial encounter     No orders of the defined types were placed in this encounter.  Patient stable at discharge.  Right foot x-ray is negative for bony abnormality including fracture or dislocation.  I have reviewed the x-ray myself and the radiologist interpretation.  I am in agreement with the radiologist interpretation.  Take OTC Tylenol as needed for pain.  Follow RICE instruction.  Follow with PCP.   Discharge Instructions Follow RICE instruction that is attached Take OTC Tylenol as needed for pain Follow-up with PCP Return or go to ED for worsening of symptoms   Reviewed  expectations re: course of current medical issues. Questions answered. Outlined signs and symptoms indicating need for more acute intervention. Patient verbalized understanding. After Visit Summary given.      Note: This document was prepared using Dragon voice recognition software and may include unintentional dictation errors.    Durward Parcel, FNP 02/14/20 1705

## 2020-02-14 NOTE — Discharge Instructions (Addendum)
Follow RICE instruction that is attached Take OTC Tylenol as needed for pain Follow-up with PCP Return or go to ED for worsening of symptoms

## 2020-02-14 NOTE — ED Triage Notes (Signed)
Pt presents with right foot injury after fall from step

## 2020-05-27 ENCOUNTER — Other Ambulatory Visit: Payer: Self-pay

## 2020-05-27 DIAGNOSIS — E559 Vitamin D deficiency, unspecified: Secondary | ICD-10-CM

## 2020-05-27 DIAGNOSIS — E782 Mixed hyperlipidemia: Secondary | ICD-10-CM

## 2020-05-27 DIAGNOSIS — R739 Hyperglycemia, unspecified: Secondary | ICD-10-CM

## 2020-05-27 DIAGNOSIS — E349 Endocrine disorder, unspecified: Secondary | ICD-10-CM

## 2020-05-27 DIAGNOSIS — R635 Abnormal weight gain: Secondary | ICD-10-CM

## 2020-05-30 LAB — COMPREHENSIVE METABOLIC PANEL
ALT: 16 IU/L (ref 0–32)
AST: 15 IU/L (ref 0–40)
Albumin/Globulin Ratio: 1.9 (ref 1.2–2.2)
Albumin: 4.1 g/dL (ref 3.8–4.8)
Alkaline Phosphatase: 191 IU/L — ABNORMAL HIGH (ref 44–121)
BUN/Creatinine Ratio: 18 (ref 9–23)
BUN: 20 mg/dL (ref 6–24)
Bilirubin Total: <0.2 mg/dL (ref 0.0–1.2)
CO2: 21 mmol/L (ref 20–29)
Calcium: 9.3 mg/dL (ref 8.7–10.2)
Chloride: 106 mmol/L (ref 96–106)
Creatinine, Ser: 1.09 mg/dL — ABNORMAL HIGH (ref 0.57–1.00)
GFR calc Af Amer: 69 mL/min/{1.73_m2} (ref >59)
GFR calc non Af Amer: 60 mL/min/{1.73_m2} (ref >59)
Globulin, Total: 2.2 g/dL (ref 1.5–4.5)
Glucose: 129 mg/dL — ABNORMAL HIGH (ref 65–99)
Potassium: 4 mmol/L (ref 3.5–5.2)
Sodium: 140 mmol/L (ref 134–144)
Total Protein: 6.3 g/dL (ref 6.0–8.5)

## 2020-05-30 LAB — LIPID PANEL
Chol/HDL Ratio: 2.4 ratio (ref 0.0–4.4)
Cholesterol, Total: 188 mg/dL (ref 100–199)
HDL: 77 mg/dL (ref >39)
LDL Chol Calc (NIH): 100 mg/dL — ABNORMAL HIGH (ref 0–99)
Triglycerides: 59 mg/dL (ref 0–149)
VLDL Cholesterol Cal: 11 mg/dL (ref 5–40)

## 2020-05-30 LAB — VITAMIN D 25 HYDROXY (VIT D DEFICIENCY, FRACTURES): Vit D, 25-Hydroxy: 45.8 ng/mL (ref 30.0–100.0)

## 2020-06-04 ENCOUNTER — Other Ambulatory Visit: Payer: Self-pay

## 2020-06-04 ENCOUNTER — Ambulatory Visit (INDEPENDENT_AMBULATORY_CARE_PROVIDER_SITE_OTHER): Payer: 59 | Admitting: "Endocrinology

## 2020-06-04 ENCOUNTER — Encounter: Payer: Self-pay | Admitting: "Endocrinology

## 2020-06-04 VITALS — BP 116/72 | HR 104 | Ht 63.0 in | Wt 144.6 lb

## 2020-06-04 DIAGNOSIS — E559 Vitamin D deficiency, unspecified: Secondary | ICD-10-CM

## 2020-06-04 DIAGNOSIS — E782 Mixed hyperlipidemia: Secondary | ICD-10-CM | POA: Diagnosis not present

## 2020-06-04 NOTE — Progress Notes (Signed)
06/04/2020, 6:58 PM                   Endocrinology follow-up note  Subjective:    Patient ID: Paula Oconnor, female    DOB: 05/19/1971, PCP Redmond School, MD   Past Medical History:  Diagnosis Date  . Hyperglycemia   . Schizoaffective disorder [facility])    Past Surgical History:  Procedure Laterality Date  . CHOLECYSTECTOMY     Social History   Socioeconomic History  . Marital status: Married    Spouse name: Not on file  . Number of children: Not on file  . Years of education: Not on file  . Highest education level: Not on file  Occupational History  . Not on file  Tobacco Use  . Smoking status: Former Research scientist (life sciences)  . Smokeless tobacco: Never Used  Substance and Sexual Activity  . Alcohol use: Never  . Drug use: Never  . Sexual activity: Not on file  Other Topics Concern  . Not on file  Social History Narrative  . Not on file   Social Determinants of Health   Financial Resource Strain:   . Difficulty of Paying Living Expenses: Not on file  Food Insecurity:   . Worried About Charity fundraiser in the Last Year: Not on file  . Ran Out of Food in the Last Year: Not on file  Transportation Needs:   . Lack of Transportation (Medical): Not on file  . Lack of Transportation (Non-Medical): Not on file  Physical Activity:   . Days of Exercise per Week: Not on file  . Minutes of Exercise per Session: Not on file  Stress:   . Feeling of Stress : Not on file  Social Connections:   . Frequency of Communication with Friends and Family: Not on file  . Frequency of Social Gatherings with Friends and Family: Not on file  . Attends Religious Services: Not on file  . Active Member of Clubs or Organizations: Not on file  . Attends Archivist Meetings: Not on file  . Marital Status: Not on file   Family History  Family history unknown: Yes   Outpatient Encounter Medications as of 06/04/2020  Medication Sig   . Calcium Carb-Cholecalciferol (CALCIUM 500 + D3 PO) Take 1 tablet by mouth daily.  . Cholecalciferol (VITAMIN D3) 125 MCG (5000 UT) CAPS Take 1 capsule (5,000 Units total) by mouth daily.  . citalopram (CELEXA) 20 MG tablet Take 20 mg by mouth 2 (two) times daily.  . clonazepam (KLONOPIN WAFER) 0.125 MG disintegrating tablet Take 0.125 mg by mouth 2 (two) times daily.    . cloZAPine (CLOZARIL) 100 MG tablet Take 600 mg by mouth 3 (three) times daily. Take 1 tab in the morning, 1 in the afternoon, and 5 before bedtime.   . ferrous sulfate (IRON SUPPLEMENT) 325 (65 FE) MG tablet Take 325 mg by mouth daily with breakfast.    . Multiple Vitamin (MULTIVITAMIN) tablet Take 1 tablet by mouth daily.  Marland Kitchen topiramate (TOPAMAX) 25 MG tablet Take 25 mg by mouth 2 (two) times daily.  . [DISCONTINUED] risperiDONE (RISPERDAL) 0.5 MG tablet Take 0.5 mg by mouth at bedtime.   No facility-administered  encounter medications on file as of 06/04/2020.   ALLERGIES: Allergies  Allergen Reactions  . Codeine Other (See Comments)    Patient doesn't remember what happened    VACCINATION STATUS:  There is no immunization history on file for this patient.  HPI Paula Oconnor is 49 y.o. female who was seen last visit in consultation for hyperglycemia   requested by Redmond School, MD.  She is returning for follow-up.  She has no new complaints today. -Patient denies any prior history of diabetes nor prediabetes.  A1c during her last visit was 5.1%, TLC was 4.8%.   She denies hypoglycemia.  She is not on any anti- diabetes medication.  -Her previsit labs ruled out adrenal insufficiency, thyroid dysfunction.  Her vitamin D level is replete now-on ongoing vitamin D supplement.  her weight is steady around 140 pounds with BMI of 25.  She is on a list of medications related to her mood disorder including clozapine, clonazepam, Klonopin, risperidone.  She has history of anemia in the past however her most recent CBC  showed hemoglobin of 13.6.  She is adopted, does not know any family history of diabetes.  She denies alcohol abuse, she denies any prior exposure to steroids.   Review of Systems Limited as above.    Objective:    BP 116/72   Pulse (!) 104   Ht 5' 3"  (1.6 m)   Wt 144 lb 9.6 oz (65.6 kg)   BMI 25.61 kg/m   Wt Readings from Last 3 Encounters:  06/04/20 144 lb 9.6 oz (65.6 kg)  11/30/19 138 lb 12.8 oz (63 kg)  11/21/18 138 lb (62.6 kg)    Physical Exam  Recent Results (from the past 2160 hour(s))  Lipid Panel     Status: Abnormal   Collection Time: 05/29/20 11:20 AM  Result Value Ref Range   Cholesterol, Total 188 100 - 199 mg/dL   Triglycerides 59 0 - 149 mg/dL   HDL 77 >39 mg/dL   VLDL Cholesterol Cal 11 5 - 40 mg/dL   LDL Chol Calc (NIH) 100 (H) 0 - 99 mg/dL   Chol/HDL Ratio 2.4 0.0 - 4.4 ratio    Comment:                                   T. Chol/HDL Ratio                                             Men  Women                               1/2 Avg.Risk  3.4    3.3                                   Avg.Risk  5.0    4.4                                2X Avg.Risk  9.6    7.1  3X Avg.Risk 23.4   11.0   VITAMIN D 25 Hydroxy (Vit-D Deficiency, Fractures)     Status: None   Collection Time: 05/29/20 11:20 AM  Result Value Ref Range   Vit D, 25-Hydroxy 45.8 30.0 - 100.0 ng/mL    Comment: Vitamin D deficiency has been defined by the [place] practice guideline as a level of serum 25-OH vitamin D less than 20 ng/mL (1,2). The Endocrine Society went on to further define vitamin D insufficiency as a level between 21 and 29 ng/mL (2). 1. IOM (Institute of Medicine). 2010. Dietary reference    intakes for calcium and D. [place]: The    [employer]. 2. Holick MF, Binkley [place], Bischoff-Ferrari HA, et al.    Evaluation, treatment, and prevention of vitamin D    deficiency: an Endocrine  Society clinical practice    guideline. JCEM. 2011 Jul; 96(7):1911-30.   Comprehensive metabolic panel     Status: Abnormal   Collection Time: 05/29/20 11:20 AM  Result Value Ref Range   Glucose 129 (H) 65 - 99 mg/dL   BUN 20 6 - 24 mg/dL   Creatinine, Ser 1.09 (H) 0.57 - 1.00 mg/dL   GFR calc non Af Amer 60 >59 mL/min/1.73   GFR calc Af Amer 69 >59 mL/min/1.73    Comment: **In accordance with recommendations from the NKF-ASN Task force,**   [facility] is in the process of updating its eGFR calculation to the   2021 CKD-EPI creatinine equation that estimates kidney function   without a race variable.    BUN/Creatinine Ratio 18 9 - 23   Sodium 140 134 - 144 mmol/L   Potassium 4.0 3.5 - 5.2 mmol/L   Chloride 106 96 - 106 mmol/L   CO2 21 20 - 29 mmol/L   Calcium 9.3 8.7 - 10.2 mg/dL   Total Protein 6.3 6.0 - 8.5 g/dL   Albumin 4.1 3.8 - 4.8 g/dL   Globulin, Total 2.2 1.5 - 4.5 g/dL   Albumin/Globulin Ratio 1.9 1.2 - 2.2   Bilirubin Total <0.2 0.0 - 1.2 mg/dL   Alkaline Phosphatase 191 (H) 44 - 121 IU/L    Comment:               **Please note reference interval change**   AST 15 0 - 40 IU/L   ALT 16 0 - 32 IU/L      Assessment & Plan:   1.  Vitamin D deficiency--replete  2.  Dyslipidemia  -She is reassured that her adrenal function is normal as well as her thyroid function.  Confirmed by her repeat previsit labs, she did not have prediabetes/diabetes, thyroid dysfunction.   -She did not have any further glycemic excursions.   -She previously had  random hyperglycemia related to her Risperdal, however she will continue to benefit from this medication and I do not suggest for her to discontinue this medication without discussion with her psychiatrist.  She will greatly benefit from dietary modification which I discussed in detail with her, is hesitant to change.  -Screening for celiac disease is negative. - she  admits there is a room for improvement in her diet and drink  choices. -  Suggestion is made for her to avoid simple carbohydrates  from her diet including Cakes, Sweet Desserts / Pastries, Ice Cream, Soda (diet and regular), Sweet Tea, Candies, Chips, Cookies, Sweet Pastries,  Store Bought Juices, Alcohol in Excess of  1-2 drinks a day, Artificial Sweeteners,  Coffee Creamer, and "Sugar-free" Products. This will help patient to have stable blood glucose profile and potentially avoid unintended weight gain.  -She is advised on avoiding butter and fried food to help her dyslipidemia.  Her current LDL is 100, improving from 107.  She will not need to start intervention at this time.  She is advised to continue her vitamin D supplements.  - Her major manifestation seems to be her schizoaffective disorder on polypharmacy, may benefit from evaluation by a psychiatrist.   - I advised her  to maintain close follow up with Redmond School, MD for primary care needs.      - Time spent on this patient care encounter:  20 minutes of which 50% was spent in  counseling and the rest reviewing  her current and  previous labs / studies and medications  doses and developing a plan for long term care. Paula Oconnor  participated in the discussions, expressed understanding, and voiced agreement with the above plans.  All questions were answered to her satisfaction. she is encouraged to contact clinic should she have any questions or concerns prior to her return visit.   Follow up plan: Return in about 1 year (around 06/04/2021) for F/U with Pre-visit Labs.   Glade Lloyd, MD [facility] [address] [place], [place] [address] Phone: [phone]  Fax: [phone]     06/04/2020, 6:58 PM  This note was partially dictated with voice recognition software. Similar sounding words can be transcribed inadequately or may not  be corrected upon review.

## 2020-06-14 ENCOUNTER — Other Ambulatory Visit (HOSPITAL_COMMUNITY): Payer: Self-pay | Admitting: Nephrology

## 2020-06-14 ENCOUNTER — Other Ambulatory Visit: Payer: Self-pay | Admitting: Nephrology

## 2020-06-14 DIAGNOSIS — N170 Acute kidney failure with tubular necrosis: Secondary | ICD-10-CM

## 2020-06-20 ENCOUNTER — Encounter (HOSPITAL_COMMUNITY): Payer: Self-pay

## 2020-06-20 ENCOUNTER — Ambulatory Visit (HOSPITAL_COMMUNITY): Payer: Medicare Other | Attending: Nephrology

## 2020-07-30 ENCOUNTER — Ambulatory Visit (HOSPITAL_COMMUNITY): Payer: Medicare Other

## 2020-07-30 ENCOUNTER — Encounter (HOSPITAL_COMMUNITY): Payer: Self-pay

## 2020-08-06 ENCOUNTER — Other Ambulatory Visit: Payer: Self-pay

## 2020-08-06 ENCOUNTER — Ambulatory Visit (HOSPITAL_COMMUNITY)
Admission: RE | Admit: 2020-08-06 | Discharge: 2020-08-06 | Disposition: A | Discharge status: Home / Self Care | Payer: 59 | Source: Ambulatory Visit | Attending: Nephrology | Admitting: Nephrology

## 2020-08-06 DIAGNOSIS — N170 Acute kidney failure with tubular necrosis: Secondary | ICD-10-CM | POA: Diagnosis not present

## 2021-02-26 ENCOUNTER — Other Ambulatory Visit: Payer: Self-pay

## 2021-02-26 ENCOUNTER — Ambulatory Visit (INDEPENDENT_AMBULATORY_CARE_PROVIDER_SITE_OTHER): Payer: 59 | Admitting: Adult Health

## 2021-02-26 ENCOUNTER — Encounter: Payer: Self-pay | Admitting: Adult Health

## 2021-02-26 ENCOUNTER — Other Ambulatory Visit (HOSPITAL_COMMUNITY)
Admission: RE | Admit: 2021-02-26 | Discharge: 2021-02-26 | Disposition: A | Discharge status: Home / Self Care | Payer: 59 | Source: Ambulatory Visit | Attending: Adult Health | Admitting: Adult Health

## 2021-02-26 VITALS — BP 103/66 | HR 96 | Ht 63.0 in | Wt 144.0 lb

## 2021-02-26 DIAGNOSIS — Z1151 Encounter for screening for human papillomavirus (HPV): Secondary | ICD-10-CM | POA: Diagnosis not present

## 2021-02-26 DIAGNOSIS — Z1211 Encounter for screening for malignant neoplasm of colon: Secondary | ICD-10-CM | POA: Diagnosis not present

## 2021-02-26 DIAGNOSIS — Z01419 Encounter for gynecological examination (general) (routine) without abnormal findings: Secondary | ICD-10-CM | POA: Insufficient documentation

## 2021-02-26 DIAGNOSIS — Z1231 Encounter for screening mammogram for malignant neoplasm of breast: Secondary | ICD-10-CM | POA: Diagnosis not present

## 2021-02-26 DIAGNOSIS — Z1212 Encounter for screening for malignant neoplasm of rectum: Secondary | ICD-10-CM

## 2021-02-26 LAB — HEMOCCULT GUIAC POC 1CARD (OFFICE): Fecal Occult Blood, POC: NEGATIVE

## 2021-02-26 NOTE — Patient Instructions (Signed)
Thank you for choosing our office today! We appreciate the opportunity to meet your healthcare needs. You may receive a short survey by e-mail or through MyChart. If you are happy with your care we would appreciate if you could take just a few minutes to complete the survey questions. We read all of your comments and take your feedback very seriously. Thank you again for choosing our office.  Anjana Cheek   [facility] Team  

## 2021-02-26 NOTE — Progress Notes (Signed)
Patient ID: Paula Oconnor, female   DOB: Jan 14, 1971, 50 y.o.   MRN: [id number] History of Present Illness:  Paula Oconnor is a 50 year old white female,married, G1P0010, in for a well woman gyn exam and pap, she is a new pt. PCP is Dr Sherwood Gambler.  Current Medications, Allergies, Past Medical History, Past Surgical History, Family History and Social History were reviewed in [employer] electronic medical record.     Review of Systems: Patient denies any headaches, hearing loss, fatigue, blurred vision, shortness of breath, chest pain, abdominal pain, problems with bowel movements, urination, or intercourse.(Not active) No joint pain or mood swings. +anxiety  Periods regular, denies any hot flashes.   Physical Exam:BP 103/66 (BP Location: Left Arm, Patient Position: Sitting, Cuff Size: Normal)   Pulse 96   Ht 5\' 3"  (1.6 m)   Wt 144 lb (65.3 kg)   LMP 02/15/2021 (Approximate)   BMI 25.51 kg/m   General:  Well developed, well nourished, no acute distress Skin:  Warm and dry Neck:  Midline trachea, normal thyroid, good ROM, no lymphadenopathy Lungs; Clear to auscultation bilaterally Breast:  No dominant palpable mass, retraction, or nipple discharge Cardiovascular: Regular rate and rhythm Abdomen:  Soft, non tender, no hepatosplenomegaly Pelvic:  External genitalia is normal in appearance, no lesions.  The vagina is normal in appearance. Urethra has no lesions or masses. The cervix is smooth, pap with HR HPV genotyping performed.  Uterus is felt to be normal size, shape, and contour.  No adnexal masses or tenderness noted.Bladder is non tender, no masses felt. Rectal: Good sphincter tone, no polyps, or hemorrhoids felt.  Hemoccult negative. Extremities/musculoskeletal:  No swelling or varicosities noted, no clubbing or cyanosis Psych:  No mood changes, alert and cooperative,seems happy AA is 0 Fall risk is moderate Depression screen PHQ 2/9 02/26/2021  Decreased Interest 2  PHQ - 2 Score 2   Altered sleeping 1  Tired, decreased energy 2  Change in appetite 0  Feeling bad or failure about yourself  2  Trouble concentrating 2  Moving slowly or fidgety/restless 1  Suicidal thoughts 0  PHQ-9 Score 10   She is on Celexa and klonopin and sees 02/28/2021.  GAD 7 : Generalized Anxiety Score 02/26/2021  Nervous, Anxious, on Edge 3  Control/stop worrying 3  Worry too much - different things 3  Trouble relaxing 3  Restless 3  Easily annoyed or irritable 3  Afraid - awful might happen 3  Total GAD 7 Score 21    Upstream - 02/26/21 1343       Pregnancy Intention Screening   Does the patient want to become pregnant in the next year? No    Does the patient's partner want to become pregnant in the next year? No    Would the patient like to discuss contraceptive options today? No      Contraception Wrap Up   Current Method Abstinence    End Method Female Condom            Examination chaperoned by 02/28/21 LPN     Impression and Plan: 1. Encounter for gynecological examination with Papanicolaou smear of cervix Pap sent Physical in 1 year  Pap in 3 years if normal Labs with PCP - Cytology - PAP( [facility])  2. Encounter for screening fecal occult blood testing  - POCT occult blood stool  3. Screening mammogram for breast cancer Pt to call Malachy Mood for appt  - MM 3D SCREEN BREAST BILATERAL;  Future  4. Screening for colorectal cancer Referred to Dr Karilyn Cota for colonoscopy  - Ambulatory referral to Gastroenterology

## 2021-02-27 ENCOUNTER — Encounter (INDEPENDENT_AMBULATORY_CARE_PROVIDER_SITE_OTHER): Payer: Self-pay | Admitting: *Deleted

## 2021-02-28 LAB — CYTOLOGY - PAP
Comment: NEGATIVE
Diagnosis: NEGATIVE
High risk HPV: NEGATIVE

## 2021-03-25 ENCOUNTER — Other Ambulatory Visit: Payer: Self-pay | Admitting: Internal Medicine

## 2021-03-25 ENCOUNTER — Other Ambulatory Visit (HOSPITAL_COMMUNITY): Payer: Self-pay | Admitting: Internal Medicine

## 2021-03-25 DIAGNOSIS — R519 Headache, unspecified: Secondary | ICD-10-CM

## 2021-04-08 ENCOUNTER — Encounter (HOSPITAL_COMMUNITY): Payer: Self-pay

## 2021-04-08 ENCOUNTER — Ambulatory Visit (HOSPITAL_COMMUNITY): Payer: Medicare Other

## 2021-05-28 ENCOUNTER — Other Ambulatory Visit: Payer: Self-pay

## 2021-05-28 DIAGNOSIS — E559 Vitamin D deficiency, unspecified: Secondary | ICD-10-CM

## 2021-05-28 DIAGNOSIS — E782 Mixed hyperlipidemia: Secondary | ICD-10-CM

## 2021-05-30 LAB — LIPID PANEL
Chol/HDL Ratio: 2.7 ratio (ref 0.0–4.4)
Cholesterol, Total: 175 mg/dL (ref 100–199)
HDL: 65 mg/dL (ref >39)
LDL Chol Calc (NIH): 96 mg/dL (ref 0–99)
Triglycerides: 73 mg/dL (ref 0–149)
VLDL Cholesterol Cal: 14 mg/dL (ref 5–40)

## 2021-05-30 LAB — VITAMIN D 25 HYDROXY (VIT D DEFICIENCY, FRACTURES): Vit D, 25-Hydroxy: 29.7 ng/mL — ABNORMAL LOW (ref 30.0–100.0)

## 2021-06-04 ENCOUNTER — Ambulatory Visit: Payer: 59 | Admitting: "Endocrinology

## 2021-06-04 ENCOUNTER — Ambulatory Visit (INDEPENDENT_AMBULATORY_CARE_PROVIDER_SITE_OTHER): Payer: 59 | Admitting: "Endocrinology

## 2021-06-04 ENCOUNTER — Encounter: Payer: Self-pay | Admitting: "Endocrinology

## 2021-06-04 VITALS — BP 100/64 | HR 80 | Ht 63.0 in | Wt 141.2 lb

## 2021-06-04 DIAGNOSIS — E559 Vitamin D deficiency, unspecified: Secondary | ICD-10-CM

## 2021-06-04 DIAGNOSIS — R739 Hyperglycemia, unspecified: Secondary | ICD-10-CM

## 2021-06-04 LAB — POCT GLYCOSYLATED HEMOGLOBIN (HGB A1C): HbA1c, POC (controlled diabetic range): 5.3 % (ref 0.0–7.0)

## 2021-06-04 NOTE — Progress Notes (Signed)
Pt states she's experiencing burning sensation in the bottom of left foot x 3-4 wks.

## 2021-06-04 NOTE — Progress Notes (Signed)
06/04/2021, 7:36 PM                   Endocrinology follow-up note  Subjective:    Patient ID: Paula Oconnor, female    DOB: 1970-08-07, PCP Elfredia Nevins, MD   Past Medical History:  Diagnosis Date   Hyperglycemia    Schizoaffective disorder [facility])    Past Surgical History:  Procedure Laterality Date   CHOLECYSTECTOMY     Social History   Socioeconomic History   Marital status: Married    Spouse name: Not on file   Number of children: Not on file   Years of education: Not on file   Highest education level: Not on file  Occupational History   Not on file  Tobacco Use   Smoking status: Former    Years: 10.00    Types: Cigarettes   Smokeless tobacco: Never  Vaping Use   Vaping Use: Never used  Substance and Sexual Activity   Alcohol use: Never   Drug use: Never   Sexual activity: Not Currently    Birth control/protection: Condom  Other Topics Concern   Not on file  Social History Narrative   Not on file   Social Determinants of Health   Financial Resource Strain: Low Risk    Difficulty of Paying Living Expenses: Not hard at all  Food Insecurity: No Food Insecurity   Worried About Programme researcher, broadcasting/film/video in the Last Year: Never true   Ran Out of Food in the Last Year: Never true  Transportation Needs: No Transportation Needs   Lack of Transportation (Medical): No   Lack of Transportation (Non-Medical): No  Physical Activity: Inactive   Days of Exercise per Week: 0 days   Minutes of Exercise per Session: 0 min  Stress: Stress Concern Present   Feeling of Stress : Very much  Social Connections: Socially Integrated   Frequency of Communication with Friends and Family: More than three times a week   Frequency of Social Gatherings with Friends and Family: Once a week   Attends Religious Services: More than 4 times per year   Active Member of [employer] or Organizations: Yes   Attends Hospital doctor: More than 4 times per year   Marital Status: Married   Family History  Family history unknown: Yes   Outpatient Encounter Medications as of 06/04/2021  Medication Sig   citalopram (CELEXA) 20 MG tablet Take 20 mg by mouth 2 (two) times daily.   clonazepam (KLONOPIN) 0.125 MG disintegrating tablet Take 0.125 mg by mouth 2 (two) times daily.     cloZAPine (CLOZARIL) 100 MG tablet Take by mouth. Take 1 tab in the morning, 1 in the afternoon, and 4 before bedtime.   dicyclomine (BENTYL) 10 MG capsule Take 10 mg by mouth 4 (four) times daily as needed.   ferrous sulfate 325 (65 FE) MG tablet Take 325 mg by mouth daily with breakfast. Take 2 daily   Multiple Vitamin (MULTIVITAMIN) tablet Take 1 tablet by mouth daily.   topiramate (TOPAMAX) 25 MG tablet Take 25 mg by mouth 2 (two) times daily.   VITAMIN D PO Take by mouth.   No facility-administered encounter medications on file  as of 06/04/2021.   ALLERGIES: Allergies  Allergen Reactions   Codeine Other (See Comments)    Patient doesn't remember what happened    VACCINATION STATUS:  There is no immunization history on file for this patient.  HPI Paula Oconnor is 50 y.o. female who was seen last visit in consultation for hyperglycemia   requested by Elfredia Nevins, MD.  She is returning for follow-up.  She is on vitamin D replacement.  She did not have any further hypoglycemia or hypoglycemia.  Her point-of-care A1c today is 5.3%.  She did not bring any logs nor meter to review.     She is not on any anti- diabetes medication.  -Her previous labs ruled out adrenal insufficiency, thyroid dysfunction.  her weight is steady around 140 pounds with BMI of 25.  She is on a list of medications related to her mood disorder including clozapine, clonazepam, Klonopin, risperidone.  She has history of anemia in the past however her most recent CBC showed hemoglobin of 13.6.  She is adopted, does not know any family history of  diabetes.  She denies alcohol abuse, she denies any prior exposure to steroids.   Review of Systems Limited as above.    Objective:    BP 100/64   Pulse 80   Ht 5\' 3"  (1.6 m)   Wt 141 lb 3.2 oz (64 kg)   BMI 25.01 kg/m   Wt Readings from Last 3 Encounters:  06/04/21 141 lb 3.2 oz (64 kg)  02/26/21 144 lb (65.3 kg)  06/04/20 144 lb 9.6 oz (65.6 kg)    Physical Exam  Recent Results (from the past 2160 hour(s))  Lipid Panel     Status: None   Collection Time: 05/29/21  7:56 AM  Result Value Ref Range   Cholesterol, Total 175 100 - 199 mg/dL   Triglycerides 73 0 - 149 mg/dL   HDL 65 05/31/21 mg/dL   VLDL Cholesterol Cal 14 5 - 40 mg/dL   LDL Chol Calc (NIH) 96 0 - 99 mg/dL   Chol/HDL Ratio 2.7 0.0 - 4.4 ratio    Comment:                                   T. Chol/HDL Ratio                                             Men  Women                               1/2 Avg.Risk  3.4    3.3                                   Avg.Risk  5.0    4.4                                2X Avg.Risk  9.6    7.1                                3X Avg.Risk  23.4   11.0   Vitamin D, 25-hydroxy     Status: Abnormal   Collection Time: 05/29/21  7:56 AM  Result Value Ref Range   Vit D, 25-Hydroxy 29.7 (L) 30.0 - 100.0 ng/mL    Comment: Vitamin D deficiency has been defined by the Institute of Medicine and an Endocrine Society practice guideline as a level of serum 25-OH vitamin D less than 20 ng/mL (1,2). The Endocrine Society went on to further define vitamin D insufficiency as a level between 21 and 29 ng/mL (2). 1. IOM (Institute of Medicine). 2010. Dietary reference    intakes for calcium and D. [place]: The    [employer]. 2. Holick MF, Binkley [place], Bischoff-Ferrari HA, et al.    Evaluation, treatment, and prevention of vitamin D    deficiency: an Endocrine Society clinical practice    guideline. JCEM. 2011 Jul; 96(7):1911-30.   HgB A1c     Status: None   Collection Time:  06/04/21  1:24 PM  Result Value Ref Range   Hemoglobin A1C     HbA1c POC (<> result, manual entry)     HbA1c, POC (prediabetic range)     HbA1c, POC (controlled diabetic range) 5.3 0.0 - 7.0 %      Assessment & Plan:   1.  Vitamin D deficiency--replete  2.  Dyslipidemia  -She is reassured that her adrenal function is normal as well as her thyroid function.  Confirmed by her repeat previsit labs, she did not have prediabetes/diabetes, thyroid dysfunction.   -She did not have any further glycemic excursions.   -We will continue to benefit from low-dose vitamin D supplement 2000 units of D3 daily.    She previously had  random hyperglycemia related to her Risperdal, however she will continue to benefit from this medication and I do not suggest for her to discontinue this medication without discussion with her psychiatrist.  She will greatly benefit from dietary modification which I discussed in detail with her, is hesitant to change.  -Screening for celiac disease is negative. - she  admits there is a room for improvement in her diet and drink choices. -  Suggestion is made for her to avoid simple carbohydrates  from her diet including Cakes, Sweet Desserts / Pastries, Ice Cream, Soda (diet and regular), Sweet Tea, Candies, Chips, Cookies, Sweet Pastries,  Store Bought Juices, Alcohol in Excess of  1-2 drinks a day, Artificial Sweeteners, Coffee Creamer, and "Sugar-free" Products. This will help patient to have stable blood glucose profile and potentially avoid unintended weight gain. Her lipid panel has shown improvement to LDL of 96 from 107.  She is not on statins.   - I advised her  to maintain close follow up with Elfredia Nevins, MD for primary care needs.  I spent 21 minutes in the care of the patient today including review of labs from Thyroid Function, CMP, and other relevant labs ; imaging/biopsy records (current and previous including abstractions from other facilities);  face-to-face time discussing  her lab results and symptoms, medications doses, her options of short and long term treatment based on the latest standards of care / guidelines;   and documenting the encounter.  Paula Oconnor  participated in the discussions, expressed understanding, and voiced agreement with the above plans.  All questions were answered to her satisfaction. she is encouraged to contact clinic should she have any questions or concerns prior to her return visit.   Follow up plan: Return in about  1 year (around 06/04/2022) for F/U with Pre-visit Labs, A1c -NV.   Marquis Lunch, MD [facility] [address] [place], [place] [address] Phone: [phone]  Fax: [phone]     06/04/2021, 7:36 PM  This note was partially dictated with voice recognition software. Similar sounding words can be transcribed inadequately or may not  be corrected upon review.

## 2022-06-10 ENCOUNTER — Ambulatory Visit: Payer: 59 | Admitting: "Endocrinology

## 2022-06-10 ENCOUNTER — Ambulatory Visit (HOSPITAL_COMMUNITY): Admission: RE | Admit: 2022-06-10 | Payer: Medicare Other | Source: Ambulatory Visit

## 2022-06-10 ENCOUNTER — Other Ambulatory Visit (HOSPITAL_COMMUNITY): Payer: Self-pay | Admitting: Internal Medicine

## 2022-06-10 ENCOUNTER — Encounter (HOSPITAL_COMMUNITY): Payer: Self-pay

## 2022-06-10 ENCOUNTER — Ambulatory Visit (HOSPITAL_COMMUNITY)
Admission: RE | Admit: 2022-06-10 | Discharge: 2022-06-10 | Disposition: A | Discharge status: Home / Self Care | Payer: 59 | Source: Ambulatory Visit | Attending: Internal Medicine | Admitting: Internal Medicine

## 2022-06-10 DIAGNOSIS — R1033 Periumbilical pain: Secondary | ICD-10-CM

## 2022-06-10 MED ORDER — IOHEXOL 300 MG/ML  SOLN
100.0000 mL | Freq: Once | INTRAMUSCULAR | Status: AC | PRN
  Administered 2022-06-10: 100 mL via INTRAVENOUS

## 2022-06-24 IMAGING — US US RENAL
1 series · 14 of 25 positions shown · non-contrast
Comparison: Prior ultrasound from 11/15/2003.

CLINICAL DATA: Initial evaluation for acute kidney failure with
tubular necrosis.

EXAM:
RENAL / URINARY TRACT ULTRASOUND COMPLETE

[Series 1: us renal · 0.20mm/px · 14 of 50 slices shown]
[im 1/50]
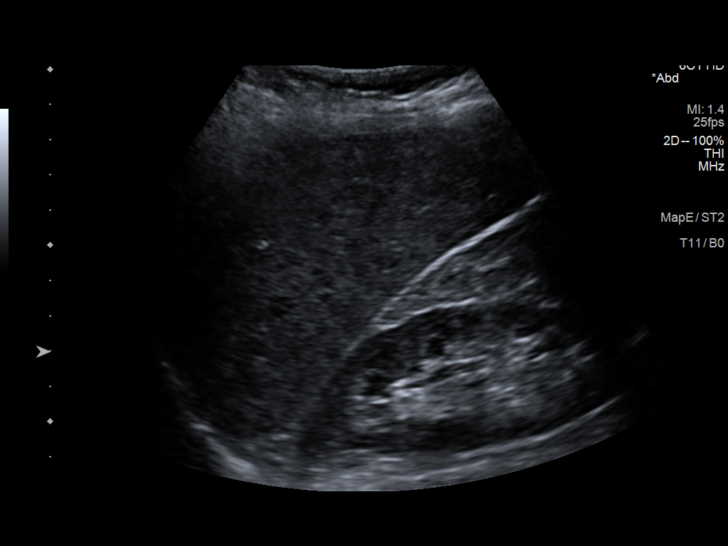
[im 5/50]
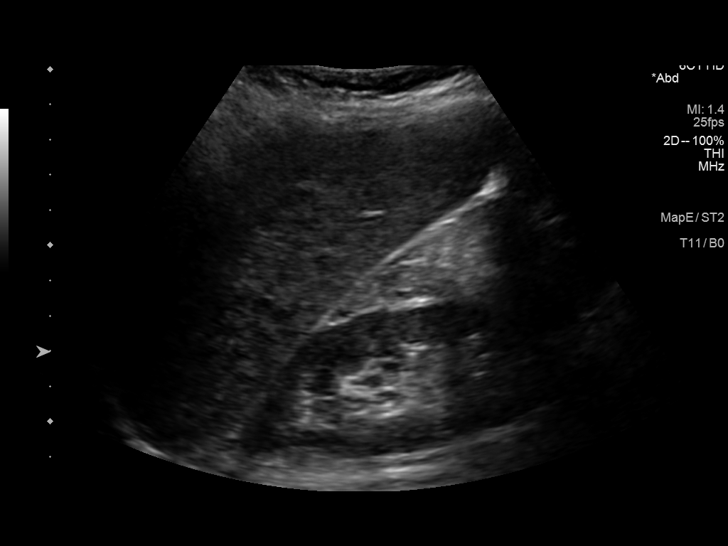
[im 9/50]
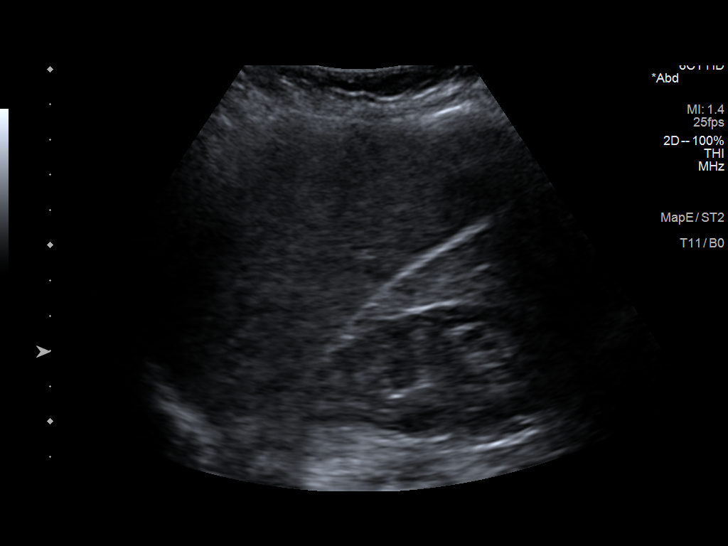
[im 13/50]
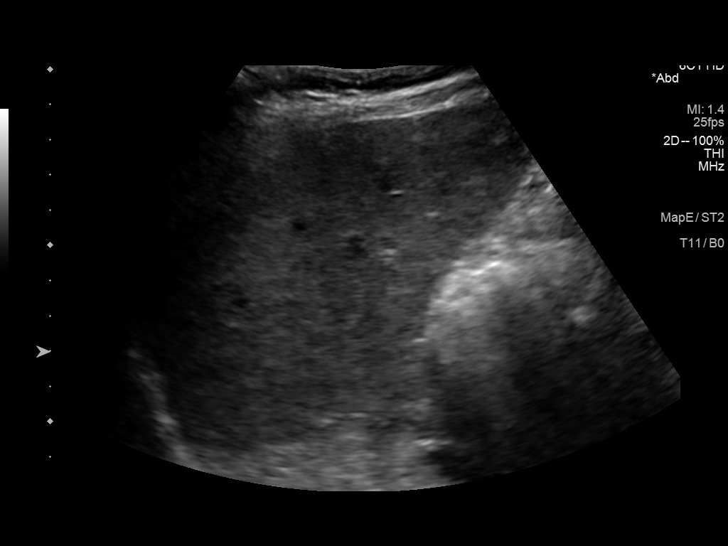
[im 17/50]
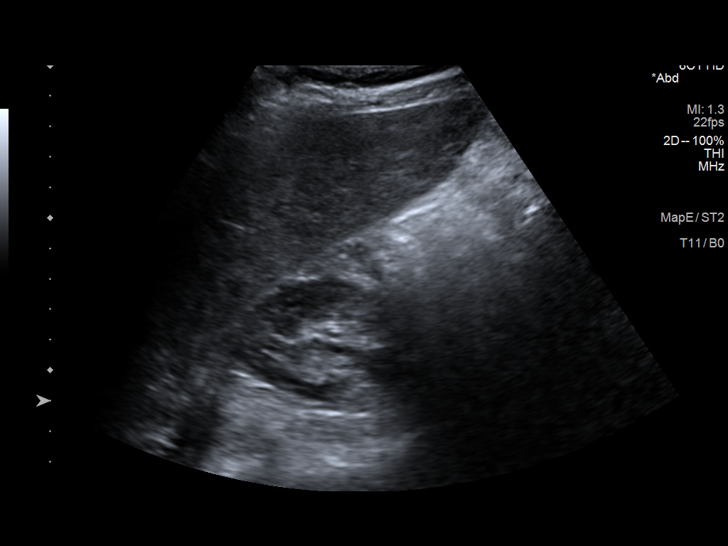
[im 19/50]
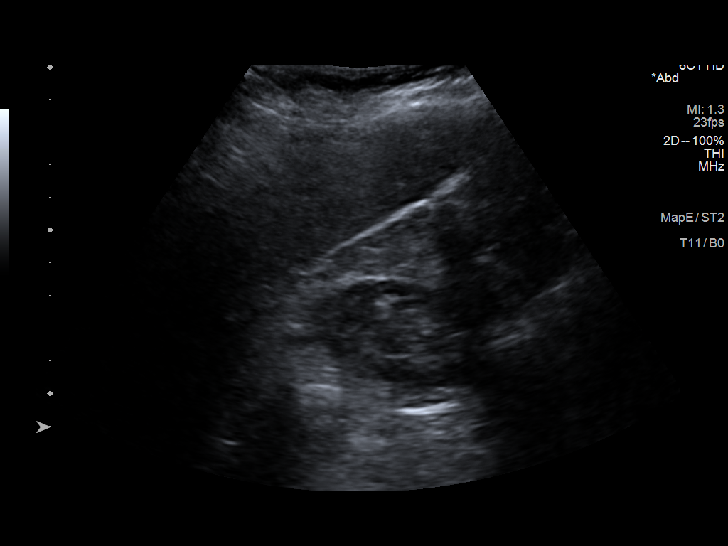
[im 23/50]
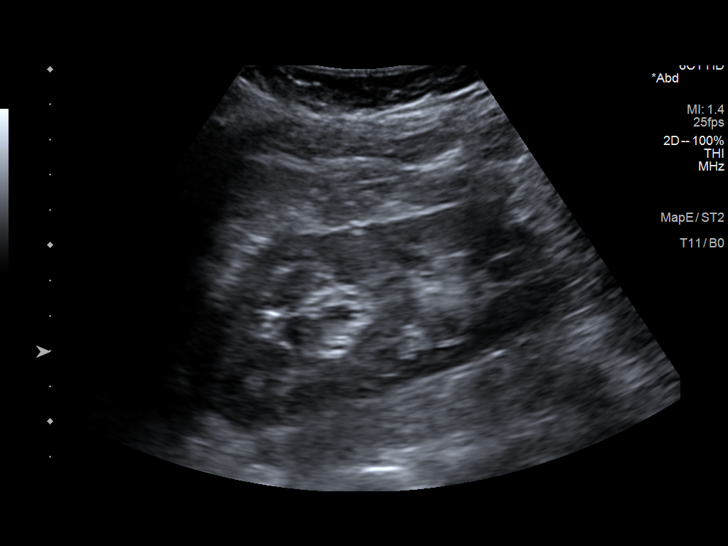
[im 27/50]
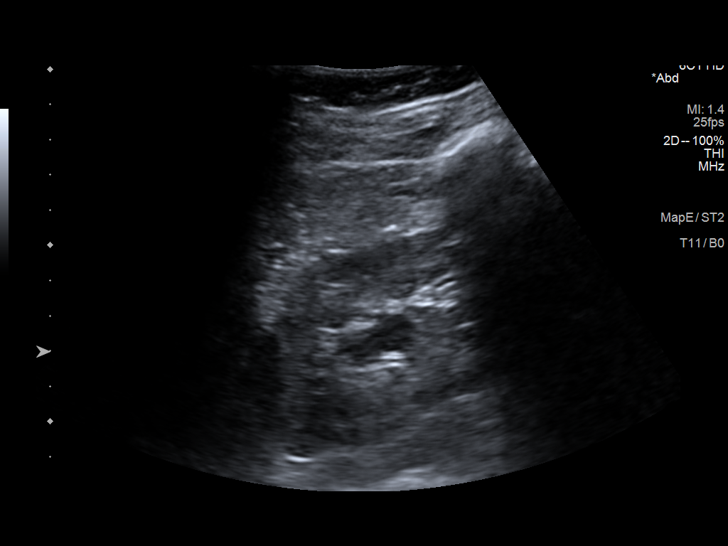
[im 31/50]
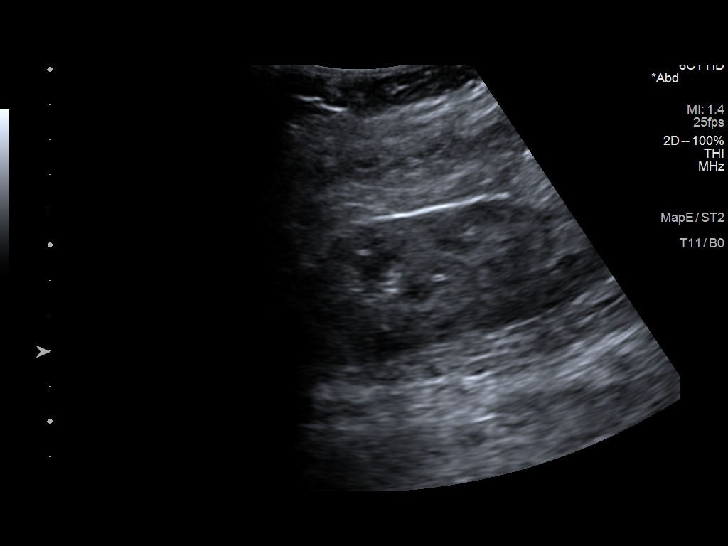
[im 33/50]
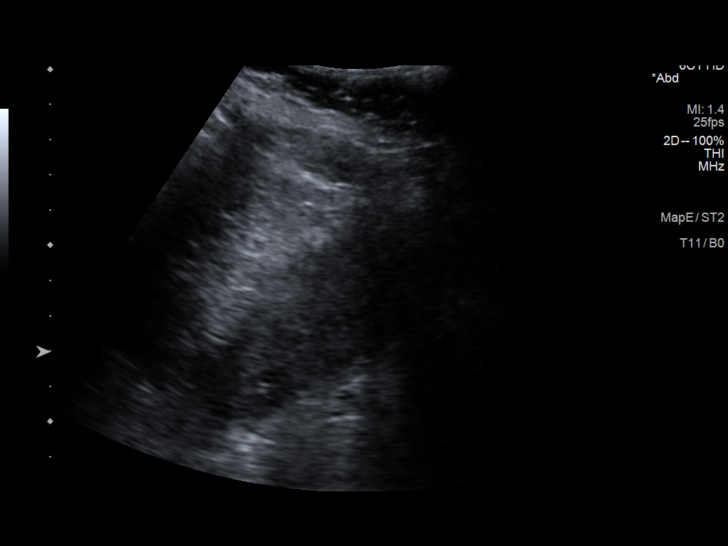
[im 37/50]
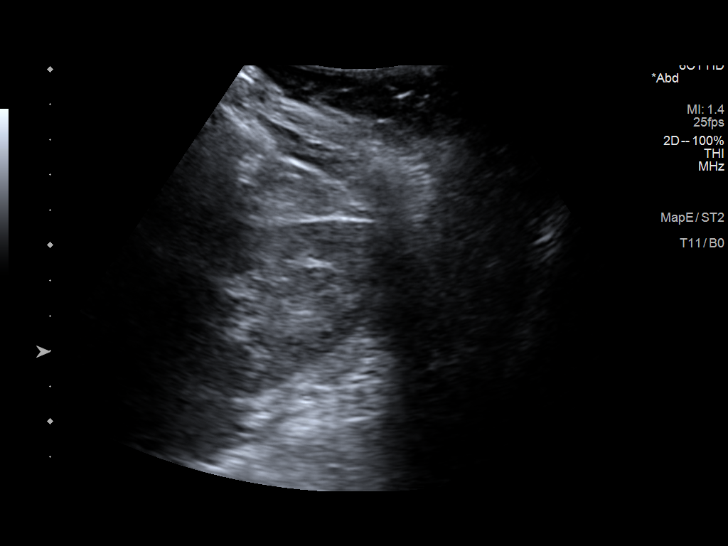
[im 41/50]
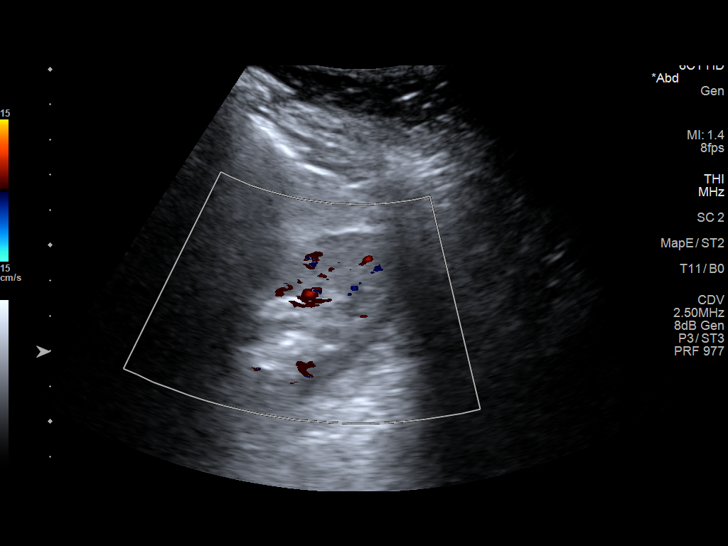
[im 45/50]
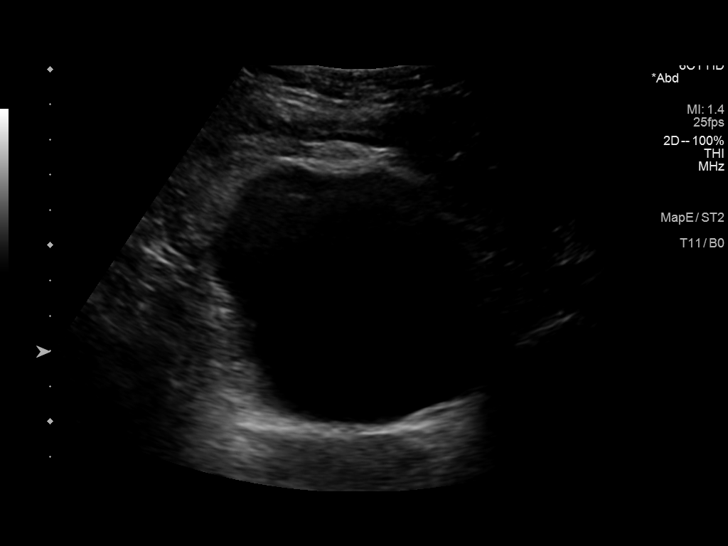
[im 50/50]
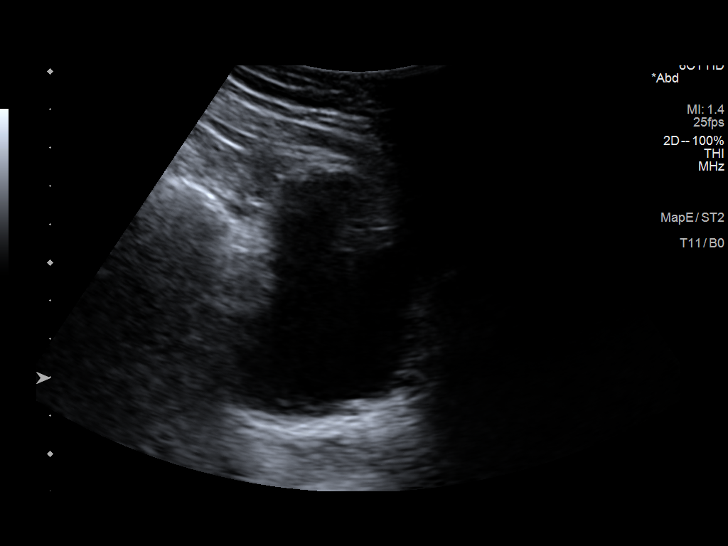

[14 of 25 positions shown; findings below may reference images not displayed]

FINDINGS: Right Kidney:

Renal measurements: 9.8 x 4.2 x 6.8 cm = volume: 146 mL. Renal
echogenicity within normal limits. No nephrolithiasis or
hydronephrosis. No focal renal mass.

Left Kidney:

Renal measurements: 10.8 x 4.8 x 5.5 cm = volume: 151 mL. Renal
echogenicity within normal limits. No nephrolithiasis or
hydronephrosis. No focal renal mass.

Bladder:

Appears normal for degree of bladder distention.

Other:

None.
IMPRESSION: Normal renal ultrasound. No hydronephrosis or other significant
finding.

## 2022-07-14 ENCOUNTER — Ambulatory Visit: Payer: 59 | Admitting: "Endocrinology

## 2022-07-14 ENCOUNTER — Other Ambulatory Visit: Payer: Self-pay

## 2022-07-14 DIAGNOSIS — R739 Hyperglycemia, unspecified: Secondary | ICD-10-CM

## 2022-07-14 DIAGNOSIS — E782 Mixed hyperlipidemia: Secondary | ICD-10-CM

## 2022-07-14 DIAGNOSIS — E559 Vitamin D deficiency, unspecified: Secondary | ICD-10-CM

## 2022-07-15 ENCOUNTER — Other Ambulatory Visit: Payer: Self-pay | Admitting: "Endocrinology

## 2022-07-15 LAB — LIPID PANEL
Chol/HDL Ratio: 2.3 ratio (ref 0.0–4.4)
Cholesterol, Total: 211 mg/dL — ABNORMAL HIGH (ref 100–199)
HDL: 92 mg/dL (ref >39)
LDL Chol Calc (NIH): 106 mg/dL — ABNORMAL HIGH (ref 0–99)
Triglycerides: 74 mg/dL (ref 0–149)
VLDL Cholesterol Cal: 13 mg/dL (ref 5–40)

## 2022-07-15 LAB — COMPREHENSIVE METABOLIC PANEL
ALT: 21 IU/L (ref 0–32)
AST: 17 IU/L (ref 0–40)
Albumin/Globulin Ratio: 2.3 — ABNORMAL HIGH (ref 1.2–2.2)
Albumin: 4.4 g/dL (ref 3.8–4.9)
Alkaline Phosphatase: 252 IU/L — ABNORMAL HIGH (ref 44–121)
BUN/Creatinine Ratio: 23 (ref 9–23)
BUN: 24 mg/dL (ref 6–24)
Bilirubin Total: <0.2 mg/dL (ref 0.0–1.2)
CO2: 21 mmol/L (ref 20–29)
Calcium: 9.1 mg/dL (ref 8.7–10.2)
Chloride: 108 mmol/L — ABNORMAL HIGH (ref 96–106)
Creatinine, Ser: 1.06 mg/dL — ABNORMAL HIGH (ref 0.57–1.00)
Globulin, Total: 1.9 g/dL (ref 1.5–4.5)
Glucose: 115 mg/dL — ABNORMAL HIGH (ref 70–99)
Potassium: 4.1 mmol/L (ref 3.5–5.2)
Sodium: 141 mmol/L (ref 134–144)
Total Protein: 6.3 g/dL (ref 6.0–8.5)
eGFR: 64 mL/min/{1.73_m2} (ref >59)

## 2022-07-15 LAB — T4, FREE: Free T4: 0.85 ng/dL (ref 0.82–1.77)

## 2022-07-15 LAB — TSH: TSH: 2.42 u[IU]/mL (ref 0.450–4.500)

## 2022-07-15 LAB — VITAMIN D 25 HYDROXY (VIT D DEFICIENCY, FRACTURES): Vit D, 25-Hydroxy: 25.7 ng/mL — ABNORMAL LOW (ref 30.0–100.0)

## 2022-07-16 ENCOUNTER — Ambulatory Visit (INDEPENDENT_AMBULATORY_CARE_PROVIDER_SITE_OTHER): Payer: 59 | Admitting: "Endocrinology

## 2022-07-16 ENCOUNTER — Encounter: Payer: Self-pay | Admitting: "Endocrinology

## 2022-07-16 VITALS — BP 102/64 | HR 96 | Ht 63.0 in | Wt 151.6 lb

## 2022-07-16 DIAGNOSIS — E559 Vitamin D deficiency, unspecified: Secondary | ICD-10-CM

## 2022-07-16 DIAGNOSIS — R748 Abnormal levels of other serum enzymes: Secondary | ICD-10-CM | POA: Diagnosis not present

## 2022-07-16 DIAGNOSIS — E782 Mixed hyperlipidemia: Secondary | ICD-10-CM | POA: Diagnosis not present

## 2022-07-16 DIAGNOSIS — R7302 Impaired glucose tolerance (oral): Secondary | ICD-10-CM

## 2022-07-16 LAB — COMPREHENSIVE METABOLIC PANEL
ALT: 20 IU/L (ref 0–32)
AST: 20 IU/L (ref 0–40)
Albumin/Globulin Ratio: 2.2 (ref 1.2–2.2)
Albumin: 4.3 g/dL (ref 3.8–4.9)
Alkaline Phosphatase: 238 IU/L — ABNORMAL HIGH (ref 44–121)
BUN/Creatinine Ratio: 22 (ref 9–23)
BUN: 24 mg/dL (ref 6–24)
Bilirubin Total: 0.2 mg/dL (ref 0.0–1.2)
CO2: 19 mmol/L — ABNORMAL LOW (ref 20–29)
Calcium: 9.2 mg/dL (ref 8.7–10.2)
Chloride: 106 mmol/L (ref 96–106)
Creatinine, Ser: 1.09 mg/dL — ABNORMAL HIGH (ref 0.57–1.00)
Globulin, Total: 2 g/dL (ref 1.5–4.5)
Glucose: 102 mg/dL — ABNORMAL HIGH (ref 70–99)
Potassium: 4.1 mmol/L (ref 3.5–5.2)
Sodium: 140 mmol/L (ref 134–144)
Total Protein: 6.3 g/dL (ref 6.0–8.5)
eGFR: 62 mL/min/{1.73_m2} (ref >59)

## 2022-07-16 LAB — LIPID PANEL
Chol/HDL Ratio: 2.3 ratio (ref 0.0–4.4)
Cholesterol, Total: 214 mg/dL — ABNORMAL HIGH (ref 100–199)
HDL: 93 mg/dL (ref >39)
LDL Chol Calc (NIH): 110 mg/dL — ABNORMAL HIGH (ref 0–99)
Triglycerides: 65 mg/dL (ref 0–149)
VLDL Cholesterol Cal: 11 mg/dL (ref 5–40)

## 2022-07-16 LAB — T4, FREE: Free T4: 0.89 ng/dL (ref 0.82–1.77)

## 2022-07-16 LAB — VITAMIN D 25 HYDROXY (VIT D DEFICIENCY, FRACTURES): Vit D, 25-Hydroxy: 26.8 ng/mL — ABNORMAL LOW (ref 30.0–100.0)

## 2022-07-16 LAB — TSH: TSH: 3.76 u[IU]/mL (ref 0.450–4.500)

## 2022-07-16 NOTE — Progress Notes (Signed)
07/16/2022, 5:37 PM                   Endocrinology follow-up note  Subjective:    Patient ID: Paula Oconnor, female    DOB: [birth year], PCP Redmond School, MD   Past Medical History:  Diagnosis Date   Hyperglycemia    Schizoaffective disorder [facility])    Past Surgical History:  Procedure Laterality Date   CHOLECYSTECTOMY     Social History   Socioeconomic History   Marital status: Married    Spouse name: Not on file   Number of children: Not on file   Years of education: Not on file   Highest education level: Not on file  Occupational History   Not on file  Tobacco Use   Smoking status: Former    Years: 10.00    Types: Cigarettes   Smokeless tobacco: Never  Vaping Use   Vaping Use: Never used  Substance and Sexual Activity   Alcohol use: Never   Drug use: Never   Sexual activity: Not Currently    Birth control/protection: Condom  Other Topics Concern   Not on file  Social History Narrative   Not on file   Social Determinants of Health   Financial Resource Strain: Low Risk  (02/26/2021)   Overall Financial Resource Strain (CARDIA)    Difficulty of Paying Living Expenses: Not hard at all  Food Insecurity: No Food Insecurity (02/26/2021)   Hunger Vital Sign    Worried About Running Out of Food in the Last Year: Never true    [place] in the Last Year: Never true  Transportation Needs: No Transportation Needs (02/26/2021)   PRAPARE - Hydrologist (Medical): No    Lack of Transportation (Non-Medical): No  Physical Activity: Inactive (02/26/2021)   Exercise Vital Sign    Days of Exercise per Week: 0 days    Minutes of Exercise per Session: 0 min  Stress: Stress Concern Present (02/26/2021)   [place]    Feeling of Stress : Very much  Social Connections: Socially Integrated (02/26/2021)   Social Connection  and Isolation Panel [NHANES]    Frequency of Communication with Friends and Family: More than three times a week    Frequency of Social Gatherings with Friends and Family: Once a week    Attends Religious Services: More than 4 times per year    Active Member of [employer] or Organizations: Yes    Attends Music therapist: More than 4 times per year    Marital Status: Married   Family History  Family history unknown: Yes   Outpatient Encounter Medications as of 07/16/2022  Medication Sig   Cholecalciferol (VITAMIN D) 50 MCG (2000 UT) CAPS Take 2,000 Units by mouth daily.   citalopram (CELEXA) 20 MG tablet Take 20 mg by mouth 2 (two) times daily.   clonazepam (KLONOPIN) 0.125 MG disintegrating tablet Take 0.125 mg by mouth 2 (two) times daily.     cloZAPine (CLOZARIL) 100 MG tablet Take by mouth. Take 1 tab in the morning, 1 in the afternoon, and 4 before bedtime.   dicyclomine (BENTYL) 10  MG capsule Take 10 mg by mouth 4 (four) times daily as needed.   ferrous sulfate 325 (65 FE) MG tablet Take 325 mg by mouth daily with breakfast. Take 2 daily   Multiple Vitamin (MULTIVITAMIN) tablet Take 1 tablet by mouth daily.   topiramate (TOPAMAX) 25 MG tablet Take 25 mg by mouth 3 (three) times daily.   [DISCONTINUED] VITAMIN D PO Take by mouth. (Patient not taking: Reported on 07/16/2022)   No facility-administered encounter medications on file as of 07/16/2022.   ALLERGIES: Allergies  Allergen Reactions   Codeine Other (See Comments)    Patient doesn't remember what happened    VACCINATION STATUS:  There is no immunization history on file for this patient.  HPI Paula Oconnor is 52 y.o. female who was seen last visit in consultation for hyperglycemia   requested by Redmond School, MD.  She is returning for follow-up.  She is on vitamin D replacement.  She did not have any further hypoglycemia or hypoglycemia.  Her point-of-care A1c today is 5.2%.  She did not bring any logs nor  meter to review.     She is not on any anti- diabetes medication.  -Her previous labs ruled out adrenal insufficiency, thyroid dysfunction.  She returns with weight gain of 11 pounds overall.  She has a BMI of 26.85 kg/m currently.      She is on a list of medications related to her mood disorder including clozapine, clonazepam, Klonopin, risperidone.  She has history of anemia in the past however her most recent CBC showed hemoglobin of 13.6.  She is adopted, does not know any family history of diabetes.  She denies alcohol abuse, she denies any prior exposure to steroids.   Review of Systems Limited as above.    Objective:    BP 102/64   Pulse 96   Ht _0  (1.6 m)   Wt 151 lb 9.6 oz (68.8 kg)   BMI 26.85 kg/m   Wt Readings from Last 3 Encounters:  07/16/22 151 lb 9.6 oz (68.8 kg)  06/04/21 141 lb 3.2 oz (64 kg)  02/26/21 144 lb (65.3 kg)    Physical Exam  Recent Results (from the past 2160 hour(s))  Lipid Panel     Status: Abnormal   Collection Time: 07/14/22  2:40 PM  Result Value Ref Range   Cholesterol, Total 211 (H) 100 - 199 mg/dL   Triglycerides 74 0 - 149 mg/dL   HDL 92 >39 mg/dL   VLDL Cholesterol Cal 13 5 - 40 mg/dL   LDL Chol Calc (NIH) 106 (H) 0 - 99 mg/dL   Chol/HDL Ratio 2.3 0.0 - 4.4 ratio    Comment:                                   T. Chol/HDL Ratio                                             Men  Women                               1/2 Avg.Risk  3.4    3.3  Avg.Risk  5.0    4.4                                2X Avg.Risk  9.6    7.1                                3X Avg.Risk 23.4   11.0   Vitamin D, 25-hydroxy     Status: Abnormal   Collection Time: 07/14/22  2:40 PM  Result Value Ref Range   Vit D, 25-Hydroxy 25.7 (L) 30.0 - 100.0 ng/mL    Comment: Vitamin D deficiency has been defined by the [address] practice guideline as a level of serum 25-OH vitamin D less than 20  ng/mL (1,2). The Endocrine Society went on to further define vitamin D insufficiency as a level between 21 and 29 ng/mL (2). 1. IOM (Institute of Medicine). 2010. Dietary reference    intakes for calcium and D. [address]: The    [employer]. 2. Holick MF, Binkley [place], Bischoff-Ferrari HA, et al.    Evaluation, treatment, and prevention of vitamin D    deficiency: an Endocrine Society clinical practice    guideline. JCEM. 2011 Jul; 96(7):1911-30.   Comprehensive metabolic panel     Status: Abnormal   Collection Time: 07/14/22  2:40 PM  Result Value Ref Range   Glucose 115 (H) 70 - 99 mg/dL   BUN 24 6 - 24 mg/dL   Creatinine, Ser 1.06 (H) 0.57 - 1.00 mg/dL   eGFR 64 >59 mL/min/1.73   BUN/Creatinine Ratio 23 9 - 23   Sodium 141 134 - 144 mmol/L   Potassium 4.1 3.5 - 5.2 mmol/L   Chloride 108 (H) 96 - 106 mmol/L   CO2 21 20 - 29 mmol/L   Calcium 9.1 8.7 - 10.2 mg/dL   Total Protein 6.3 6.0 - 8.5 g/dL   Albumin 4.4 3.8 - 4.9 g/dL   Globulin, Total 1.9 1.5 - 4.5 g/dL   Albumin/Globulin Ratio 2.3 (H) 1.2 - 2.2   Bilirubin Total <0.2 0.0 - 1.2 mg/dL   Alkaline Phosphatase 252 (H) 44 - 121 IU/L   AST 17 0 - 40 IU/L   ALT 21 0 - 32 IU/L  T4, Free     Status: None   Collection Time: 07/14/22  2:40 PM  Result Value Ref Range   Free T4 0.85 0.82 - 1.77 ng/dL  TSH     Status: None   Collection Time: 07/14/22  2:40 PM  Result Value Ref Range   TSH 2.420 0.450 - 4.500 uIU/mL  Comprehensive metabolic panel     Status: Abnormal   Collection Time: 07/15/22  9:22 AM  Result Value Ref Range   Glucose 102 (H) 70 - 99 mg/dL   BUN 24 6 - 24 mg/dL   Creatinine, Ser 1.09 (H) 0.57 - 1.00 mg/dL   eGFR 62 >59 mL/min/1.73   BUN/Creatinine Ratio 22 9 - 23   Sodium 140 134 - 144 mmol/L   Potassium 4.1 3.5 - 5.2 mmol/L   Chloride 106 96 - 106 mmol/L   CO2 19 (L) 20 - 29 mmol/L   Calcium 9.2 8.7 - 10.2 mg/dL   Total Protein 6.3 6.0 - 8.5 g/dL   Albumin 4.3 3.8 - 4.9 g/dL    Globulin, Total 2.0 1.5 -  4.5 g/dL   Albumin/Globulin Ratio 2.2 1.2 - 2.2   Bilirubin Total 0.2 0.0 - 1.2 mg/dL   Alkaline Phosphatase 238 (H) 44 - 121 IU/L   AST 20 0 - 40 IU/L   ALT 20 0 - 32 IU/L  Lipid panel     Status: Abnormal   Collection Time: 07/15/22  9:22 AM  Result Value Ref Range   Cholesterol, Total 214 (H) 100 - 199 mg/dL   Triglycerides 65 0 - 149 mg/dL   HDL 93 >39 mg/dL   VLDL Cholesterol Cal 11 5 - 40 mg/dL   LDL Chol Calc (NIH) 110 (H) 0 - 99 mg/dL   Chol/HDL Ratio 2.3 0.0 - 4.4 ratio    Comment:                                   T. Chol/HDL Ratio                                             Men  Women                               1/2 Avg.Risk  3.4    3.3                                   Avg.Risk  5.0    4.4                                2X Avg.Risk  9.6    7.1                                3X Avg.Risk 23.4   11.0   T4, free     Status: None   Collection Time: 07/15/22  9:22 AM  Result Value Ref Range   Free T4 0.89 0.82 - 1.77 ng/dL  TSH     Status: None   Collection Time: 07/15/22  9:22 AM  Result Value Ref Range   TSH 3.760 0.450 - 4.500 uIU/mL  VITAMIN D 25 Hydroxy (Vit-D Deficiency, Fractures)     Status: Abnormal   Collection Time: 07/15/22  9:22 AM  Result Value Ref Range   Vit D, 25-Hydroxy 26.8 (L) 30.0 - 100.0 ng/mL    Comment: Vitamin D deficiency has been defined by the [place] and an Endocrine Society practice guideline as a level of serum 25-OH vitamin D less than 20 ng/mL (1,2). The Endocrine Society went on to further define vitamin D insufficiency as a level between 21 and 29 ng/mL (2). 1. IOM (Institute of Medicine). 2010. Dietary reference    intakes for calcium and D. [place]: The    [employer]. 2. Holick MF, Binkley [place], Bischoff-Ferrari HA, et al.    Evaluation, treatment, and prevention of vitamin D    deficiency: an Endocrine Society clinical practice    guideline. JCEM. 2011 Jul;  96(7):1911-30.       Assessment & Plan:   1.  Vitamin D deficiency--replete  2.  Dyslipidemia,  elevated alkaline phosphatase 3.  Vitamin D deficiency 4.  Impaired glucose tolerance  -She is reassured that her adrenal function is normal as well as her thyroid function.  Confirmed by her repeat previsit labs, she did not have prediabetes/diabetes, thyroid dysfunction.   -She did not have any further glycemic excursions.   -We will continue to benefit from low-dose vitamin D 3, 2000 units daily.     She previously had  random hyperglycemia related to her Risperdal, however she will continue to benefit from this medication and I do not suggest for her to discontinue this medication without discussion with her psychiatrist.  - she acknowledges that there is a room for improvement in her food and drink choices. - Suggestion is made for her to avoid simple carbohydrates  from her diet including Cakes, Sweet Desserts, Ice Cream, Soda (diet and regular), Sweet Tea, Candies, Chips, Cookies, Store Bought Juices, Alcohol , Artificial Sweeteners,  Coffee Creamer, and "Sugar-free" Products, Lemonade. This will help patient to have more stable blood glucose profile and potentially avoid unintended weight gain.  The following Lifestyle Medicine recommendations according to [place]  [facility]) were discussed and and offered to patient and she  agrees to start the journey:  A. Whole Foods, Plant-Based Nutrition comprising of fruits and vegetables, plant-based proteins, whole-grain carbohydrates was discussed in detail with the patient.   A list for source of those nutrients were also provided to the patient.  Patient will use only water or unsweetened tea for hydration. B.  The need to stay away from risky substances including alcohol, smoking; obtaining 7 to 9 hours of restorative sleep, at least 150 minutes of moderate intensity exercise weekly, the importance of healthy social  connections,  and stress management techniques were discussed. C.  A full color page of  Calorie density of various food groups per pound showing examples of each food groups was provided to the patient.    -Screening for celiac disease is negative.  Hyperlipidemia with LDL of 110.  Whole food plant-based diet is discussed and recommended for her.  See above.  - I advised her  to maintain close follow up with Redmond School, MD for primary care needs.   I spent 26 minutes in the care of the patient today including review of labs from Thyroid Function, CMP, and other relevant labs ; imaging/biopsy records (current and previous including abstractions from other facilities); face-to-face time discussing  her lab results and symptoms, medications doses, her options of short and long term treatment based on the latest standards of care / guidelines;   and documenting the encounter.  Paula Oconnor  participated in the discussions, expressed understanding, and voiced agreement with the above plans.  All questions were answered to her satisfaction. she is encouraged to contact clinic should she have any questions or concerns prior to her return visit.   Follow up plan: Return in about 6 months (around 01/14/2023) for Fasting Labs  in AM B4 8.   Glade Lloyd, MD [facility] [address] [place], [place] [address] Phone: [phone]  Fax: [phone]     07/16/2022, 5:37 PM  This note was partially dictated with voice recognition software. Similar sounding words can be transcribed inadequately or may not  be corrected upon review.

## 2022-07-16 NOTE — Patient Instructions (Signed)

## 2022-07-17 LAB — POCT GLYCOSYLATED HEMOGLOBIN (HGB A1C): HbA1c, POC (controlled diabetic range): 5.2 % (ref 0.0–7.0)

## 2022-07-17 NOTE — Addendum Note (Signed)
Addended by: Ellin Saba on: 07/17/2022 08:03 AM   Modules accepted: Orders

## 2022-07-27 ENCOUNTER — Ambulatory Visit: Payer: 59 | Admitting: "Endocrinology

## 2022-07-30 ENCOUNTER — Ambulatory Visit: Payer: 59 | Admitting: "Endocrinology

## 2022-12-06 ENCOUNTER — Ambulatory Visit
Admission: EM | Admit: 2022-12-06 | Discharge: 2022-12-06 | Disposition: A | Discharge status: Home / Self Care | Payer: Medicare Other | Attending: Nurse Practitioner | Admitting: Nurse Practitioner

## 2022-12-06 ENCOUNTER — Encounter: Payer: Self-pay | Admitting: Emergency Medicine

## 2022-12-06 DIAGNOSIS — B353 Tinea pedis: Secondary | ICD-10-CM | POA: Diagnosis not present

## 2022-12-06 MED ORDER — CLOTRIMAZOLE-BETAMETHASONE 1-0.05 % EX CREA: TOPICAL_CREAM | CUTANEOUS | 0 refills | Status: DC

## 2022-12-06 NOTE — ED Triage Notes (Signed)
Rash on right foot x a few months.

## 2022-12-06 NOTE — ED Provider Notes (Signed)
[facility]    CSN: [id number] Arrival date & time: 12/06/22  1451      History   Chief Complaint No chief complaint on file.   HPI KES WESTMORELAND is a 52 y.o. female.   The history is provided by the patient.    Past Medical History:  Diagnosis Date   Hyperglycemia    Schizoaffective disorder [facility])     Patient Active Problem List   Diagnosis Date Noted   Elevated alkaline phosphatase level 07/16/2022   Encounter for screening fecal occult blood testing 02/26/2021   Encounter for gynecological examination with Papanicolaou smear of cervix 02/26/2021   Mixed hyperlipidemia 11/30/2019   Vitamin D deficiency 11/30/2018   Hyperglycemia 11/30/2018   Impaired glucose tolerance 11/21/2018   Weight gain 11/21/2018    Past Surgical History:  Procedure Laterality Date   CHOLECYSTECTOMY      OB History     Gravida  1   Para      Term      Preterm      AB  1   Living         SAB      IAB      Ectopic      Multiple      Live Births               Home Medications    Prior to Admission medications   Medication Sig Start Date End Date Taking? Authorizing Provider  Cholecalciferol (VITAMIN D) 50 MCG (2000 UT) CAPS Take 2,000 Units by mouth daily.    [provider]  citalopram (CELEXA) 20 MG tablet Take 20 mg by mouth 2 (two) times daily. 11/22/19   [provider]  clonazepam (KLONOPIN) 0.125 MG disintegrating tablet Take 0.125 mg by mouth 2 (two) times daily.      [provider]  cloZAPine (CLOZARIL) 100 MG tablet Take by mouth. Take 1 tab in the morning, 1 in the afternoon, and 4 before bedtime.    [provider]  dicyclomine (BENTYL) 10 MG capsule Take 10 mg by mouth 4 (four) times daily as needed. 05/28/21   [provider]  ferrous sulfate 325 (65 FE) MG tablet Take 325 mg by mouth daily with breakfast. Take 2 daily    [provider]  Multiple Vitamin (MULTIVITAMIN) tablet  Take 1 tablet by mouth daily.    [provider]  topiramate (TOPAMAX) 25 MG tablet Take 25 mg by mouth 3 (three) times daily. 05/03/20   [provider]    Family History Family History  Family history unknown: Yes    Social History Social History   Tobacco Use   Smoking status: Former    Years: 10    Types: Cigarettes   Smokeless tobacco: Never  Vaping Use   Vaping Use: Never used  Substance Use Topics   Alcohol use: Never   Drug use: Never     Allergies   Codeine   Review of Systems Review of Systems   Physical Exam Triage Vital Signs ED Triage Vitals  Enc Vitals Group     BP 12/06/22 1455 122/80     Pulse Rate 12/06/22 1455 (!) 113     Resp 12/06/22 1455 18     Temp 12/06/22 1455 97.6 F (36.4 C)     Temp src --      SpO2 12/06/22 1455 97 %     Weight --  Height --      Head Circumference --      Peak Flow --      Pain Score 12/06/22 1456 0     Pain Loc --      Pain Edu? --      Excl. in GC? --    No data found.  Updated Vital Signs BP 122/80 (BP Location: Right Arm)   Pulse (!) 113   Temp 97.6 F (36.4 C)   Resp 18   SpO2 97%   Visual Acuity Right Eye Distance:   Left Eye Distance:   Bilateral Distance:    Right Eye Near:   Left Eye Near:    Bilateral Near:     Physical Exam Vitals and nursing note reviewed.  Constitutional:      General: She is not in acute distress.    Appearance: Normal appearance.  Eyes:     Extraocular Movements: Extraocular movements intact.     Pupils: Pupils are equal, round, and reactive to light.  Pulmonary:     Effort: Pulmonary effort is normal.  Musculoskeletal:     Cervical back: Normal range of motion.  Feet:     Right foot:     Skin integrity: Erythema and dry skin present. No ulcer, blister, warmth or fissure.     Toenail Condition: Fungal disease present. Skin:    General: Skin is warm and dry.     Findings: Rash present.  Neurological:     General: No focal  deficit present.     Mental Status: She is alert and oriented to person, place, and time.  Psychiatric:        Mood and Affect: Mood normal.        Behavior: Behavior normal.      UC Treatments / Results  Labs (all labs ordered are listed, but only abnormal results are displayed) Labs Reviewed - No data to display  EKG   Radiology No results found.  Procedures Procedures (including critical care time)  Medications Ordered in UC Medications - No data to display  Initial Impression / Assessment and Plan / UC Course  I have reviewed the triage vital signs and the nursing notes.  Pertinent labs & imaging results that were available during my care of the patient were reviewed by me and considered in my medical decision making (see chart for details).  The patient is well-appearing, she is in no acute distress, vital signs are stable.  Patient with rash to the right foot that is been present for the past several months.  Symptoms appear to be consistent with tinea pedis.  Will treat with Lotrisone cream.  Supportive care recommendations were provided and discussed with the patient to include rest wearing normal socks, keeping the feet clean and dry, and wearing shoes that will allow air to move around.  Patient was advised that if symptoms do not improve, recommend following up with her primary care physician for further evaluation.  Patient is in agreement with this plan of care and verbalizes understanding.  All questions were answered.  Patient stable for discharge.   Final Clinical Impressions(s) / UC Diagnoses   Final diagnoses:  None   Discharge Instructions   None    ED Prescriptions   None    PDMP not reviewed this encounter.   Abran Cantor, NP 12/06/22 1521

## 2022-12-06 NOTE — Discharge Instructions (Signed)
Apply medication as prescribed. Keep your feet dry by wearing cotton or wool socks. Change your socks every day or if they become wet. Wear shoes that allow air to move around, such as sandals or canvas tennis shoes. Wash and dry your feet, including the area between your toes. As discussed, if symptoms do not improve with this treatment, please follow-up with your primary care physician for further evaluation. Follow-up as needed.

## 2022-12-21 ENCOUNTER — Ambulatory Visit (INDEPENDENT_AMBULATORY_CARE_PROVIDER_SITE_OTHER): Payer: 59

## 2022-12-21 ENCOUNTER — Ambulatory Visit
Admission: EM | Admit: 2022-12-21 | Discharge: 2022-12-21 | Disposition: A | Discharge status: Home / Self Care | Payer: 59 | Attending: Nurse Practitioner | Admitting: Nurse Practitioner

## 2022-12-21 DIAGNOSIS — M79672 Pain in left foot: Secondary | ICD-10-CM

## 2022-12-21 NOTE — ED Provider Notes (Signed)
[facility]    CSN: [id number] Arrival date & time: 12/21/22  1457      History   Chief Complaint No chief complaint on file.   HPI Paula Oconnor is a 52 y.o. female.   Patient presents today with 1 to 2-week history of left heel pain.  She denies recent fall, trauma, or known injury to the heel.  No swelling, redness, bruising, skin changes to the left heel.  No weakness with weightbearing or walking, numbness or tingling in the toes.  Denies history of bone spurs or Achilles injury.  Has not taken anything for symptoms so far.    Past Medical History:  Diagnosis Date   Hyperglycemia    Schizoaffective disorder [facility])     Patient Active Problem List   Diagnosis Date Noted   Elevated alkaline phosphatase level 07/16/2022   Encounter for screening fecal occult blood testing 02/26/2021   Encounter for gynecological examination with Papanicolaou smear of cervix 02/26/2021   Mixed hyperlipidemia 11/30/2019   Vitamin D deficiency 11/30/2018   Hyperglycemia 11/30/2018   Impaired glucose tolerance 11/21/2018   Weight gain 11/21/2018    Past Surgical History:  Procedure Laterality Date   CHOLECYSTECTOMY      OB History     Gravida  1   Para      Term      Preterm      AB  1   Living         SAB      IAB      Ectopic      Multiple      Live Births               Home Medications    Prior to Admission medications   Medication Sig Start Date End Date Taking? Authorizing Provider  Cholecalciferol (VITAMIN D) 50 MCG (2000 UT) CAPS Take 2,000 Units by mouth daily.    [provider]  citalopram (CELEXA) 20 MG tablet Take 20 mg by mouth 2 (two) times daily. 11/22/19   [provider]  clonazepam (KLONOPIN) 0.125 MG disintegrating tablet Take 0.125 mg by mouth 2 (two) times daily.      [provider]  clotrimazole-betamethasone (LOTRISONE) cream Apply to affected area 2 times daily prn 12/06/22   Leath-Warren,  Sadie Haber, NP  cloZAPine (CLOZARIL) 100 MG tablet Take by mouth. Take 1 tab in the morning, 1 in the afternoon, and 4 before bedtime.    [provider]  dicyclomine (BENTYL) 10 MG capsule Take 10 mg by mouth 4 (four) times daily as needed. 05/28/21   [provider]  ferrous sulfate 325 (65 FE) MG tablet Take 325 mg by mouth daily with breakfast. Take 2 daily    [provider]  Multiple Vitamin (MULTIVITAMIN) tablet Take 1 tablet by mouth daily.    [provider]  topiramate (TOPAMAX) 25 MG tablet Take 25 mg by mouth 3 (three) times daily. 05/03/20   [provider]    Family History Family History  Family history unknown: Yes    Social History Social History   Tobacco Use   Smoking status: Former    Years: 10    Types: Cigarettes   Smokeless tobacco: Never  Vaping Use   Vaping Use: Never used  Substance Use Topics   Alcohol use: Never   Drug use: Never     Allergies   Codeine   Review of Systems Review of Systems Per  HPI  Physical Exam Triage Vital Signs ED Triage Vitals [12/21/22 1611]  Enc Vitals Group     BP 130/77     Pulse Rate (!) 107     Resp 18     Temp 97.6 F (36.4 C)     Temp Source Oral     SpO2 95 %     Weight      Height      Head Circumference      Peak Flow      Pain Score 8     Pain Loc      Pain Edu?      Excl. in GC?    No data found.  Updated Vital Signs BP 130/77 (BP Location: Right Arm)   Pulse (!) 107   Temp 97.6 F (36.4 C) (Oral)   Resp 18   SpO2 95%   Visual Acuity Right Eye Distance:   Left Eye Distance:   Bilateral Distance:    Right Eye Near:   Left Eye Near:    Bilateral Near:     Physical Exam Vitals and nursing note reviewed.  Constitutional:      General: She is not in acute distress.    Appearance: Normal appearance. She is not toxic-appearing.  HENT:     Mouth/Throat:     Mouth: Mucous membranes are moist.     Pharynx: Oropharynx is clear.   Pulmonary:     Effort: Pulmonary effort is normal. No respiratory distress.  Musculoskeletal:       Feet:     Comments: Inspection: No swelling, obvious deformity, redness, or bruising to left heel diffusely Palpation: Left heel tender to deep palpation; no obvious deformities palpated ROM: Full ROM to ankle and flexibility of left foot  Strength: 5/5 bilateral lower Neurovascular: neurovascularly intact in left and right lower extremity   Skin:    General: Skin is warm and dry.     Capillary Refill: Capillary refill takes less than 2 seconds.     Coloration: Skin is not jaundiced or pale.     Findings: No erythema.  Neurological:     Mental Status: She is alert and oriented to person, place, and time.  Psychiatric:        Behavior: Behavior is cooperative.      UC Treatments / Results  Labs (all labs ordered are listed, but only abnormal results are displayed) Labs Reviewed - No data to display  EKG   Radiology DG Os Calcis Left  Result Date: 12/21/2022 CLINICAL DATA:  Heel pain for 2 weeks.  No known injury. EXAM: LEFT OS CALCIS - 2+ VIEW COMPARISON:  Right foot radiographs 02/14/2020 FINDINGS: Mild-to-moderate plantar calcaneal heel spur, similar to prior. Joint spaces are preserved. No acute fracture or dislocation. IMPRESSION: Mild-to-moderate plantar calcaneal heel spur, similar to prior. Electronically Signed   By: Neita Garnet M.D.   On: 12/21/2022 17:36    Procedures Procedures (including critical care time)  Medications Ordered in UC Medications - No data to display  Initial Impression / Assessment and Plan / UC Course  I have reviewed the triage vital signs and the nursing notes.  Pertinent labs & imaging results that were available during my care of the patient were reviewed by me and considered in my medical decision making (see chart for details).   Patient is well-appearing, normotensive, afebrile, not tachypneic, oxygenating well on room air.  Patient  is mildly tachycardic today in triage.  1. Pain of left heel  X-ray imaging today shows plantar calcaneal heel spur Differentials include plantar fasciitis versus pain from heel spur Rehabilitation exercises discussed and handout given Recommended Tylenol 500 to 1000 mg every 6 hours as needed for pain Recommended heel lifts/inserts for comfort Follow-up with podiatry for persistent/worsening symptoms despite treatment  The patient was given the opportunity to ask questions.  All questions answered to their satisfaction.  The patient is in agreement to this plan.   Final Clinical Impressions(s) / UC Diagnoses   Final diagnoses:  Pain of left heel     Discharge Instructions      The xray today shows a bone spur on your heel.  Please rest your foot and apply ice 15 minutes on, 45 minutes off every hour while awake.  Recommend using heel inserts to help alleviate the pressure on the heel.     ED Prescriptions   None    PDMP not reviewed this encounter.   Valentino Nose, NP 12/21/22 1920

## 2022-12-21 NOTE — Discharge Instructions (Addendum)
The xray today shows a bone spur on your heel.  Please rest your foot and apply ice 15 minutes on, 45 minutes off every hour while awake.  Recommend using heel inserts to help alleviate the pressure on the heel.

## 2022-12-21 NOTE — ED Triage Notes (Signed)
Pt reports her left heel is painful to walk on x 1 week.  Pt denies injury

## 2022-12-29 ENCOUNTER — Other Ambulatory Visit: Payer: Self-pay | Admitting: "Endocrinology

## 2022-12-29 DIAGNOSIS — R748 Abnormal levels of other serum enzymes: Secondary | ICD-10-CM

## 2022-12-29 DIAGNOSIS — E782 Mixed hyperlipidemia: Secondary | ICD-10-CM

## 2022-12-29 DIAGNOSIS — E559 Vitamin D deficiency, unspecified: Secondary | ICD-10-CM

## 2023-01-18 ENCOUNTER — Ambulatory Visit: Payer: 59 | Admitting: "Endocrinology

## 2023-02-17 ENCOUNTER — Other Ambulatory Visit (HOSPITAL_COMMUNITY): Payer: Self-pay | Admitting: Adult Health

## 2023-02-17 DIAGNOSIS — Z1231 Encounter for screening mammogram for malignant neoplasm of breast: Secondary | ICD-10-CM

## 2023-03-01 ENCOUNTER — Ambulatory Visit (HOSPITAL_COMMUNITY)
Admission: RE | Admit: 2023-03-01 | Discharge: 2023-03-01 | Disposition: A | Discharge status: Home / Self Care | Payer: 59 | Source: Ambulatory Visit | Attending: Adult Health | Admitting: Adult Health

## 2023-03-01 ENCOUNTER — Encounter (HOSPITAL_COMMUNITY): Payer: Self-pay

## 2023-03-01 DIAGNOSIS — Z1231 Encounter for screening mammogram for malignant neoplasm of breast: Secondary | ICD-10-CM | POA: Diagnosis present

## 2023-03-04 ENCOUNTER — Telehealth: Payer: Self-pay | Admitting: *Deleted

## 2023-03-04 NOTE — Telephone Encounter (Signed)
Pt aware mammogram was negative. Repeat in 1 year. Pt voiced understanding. JSY

## 2023-03-04 NOTE — Telephone Encounter (Signed)
-----   Message from [place] sent at 03/03/2023  4:53 PM EDT ----- Let her know mammogram was negative repeat in 1 year

## 2023-03-10 LAB — LIPID PANEL: VLDL Cholesterol Cal: 14 mg/dL (ref 5–40)

## 2023-03-16 ENCOUNTER — Telehealth: Payer: Self-pay | Admitting: "Endocrinology

## 2023-03-16 NOTE — Telephone Encounter (Signed)
Patient called and said that she did not fast for her labs and wants to know if you want her to repeat

## 2023-03-23 ENCOUNTER — Encounter: Payer: Self-pay | Admitting: "Endocrinology

## 2023-03-23 ENCOUNTER — Ambulatory Visit (INDEPENDENT_AMBULATORY_CARE_PROVIDER_SITE_OTHER): Payer: 59 | Admitting: "Endocrinology

## 2023-03-23 VITALS — BP 102/64 | HR 104 | Ht 63.0 in | Wt 152.8 lb

## 2023-03-23 DIAGNOSIS — R7302 Impaired glucose tolerance (oral): Secondary | ICD-10-CM

## 2023-03-23 DIAGNOSIS — R748 Abnormal levels of other serum enzymes: Secondary | ICD-10-CM | POA: Diagnosis not present

## 2023-03-23 DIAGNOSIS — E559 Vitamin D deficiency, unspecified: Secondary | ICD-10-CM | POA: Diagnosis not present

## 2023-03-23 DIAGNOSIS — E782 Mixed hyperlipidemia: Secondary | ICD-10-CM

## 2023-03-23 NOTE — Patient Instructions (Signed)
                                     Advice for Weight Management  -For most of us the best way to lose weight is by diet management. Generally speaking, diet management means consuming less calories intentionally which over time brings about progressive weight loss.  This can be achieved more effectively by avoiding ultra processed carbohydrates, processed meats, unhealthy fats.    It is critically important to know your numbers: how much calorie you are consuming and how much calorie you need. More importantly, our carbohydrates sources should be unprocessed naturally occurring  complex starch food items.  It is always important to balance nutrition also by  appropriate intake of proteins (mainly plant-based), healthy fats/oils, plenty of fruits and vegetables.   -The American College of Lifestyle Medicine (ACL M) recommends nutrition derived mostly from Whole Food, Plant Predominant Sources example an apple instead of applesauce or apple pie. Eat Plenty of vegetables, Mushrooms, fruits, Legumes, Whole Grains, Nuts, seeds in lieu of processed meats, processed snacks/pastries red meat, poultry, eggs.  Use only water or unsweetened tea for hydration.  The College also recommends the need to stay away from risky substances including alcohol, smoking; obtaining 7-9 hours of restorative sleep, at least 150 minutes of moderate intensity exercise weekly, importance of healthy social connections, and being mindful of stress and seek help when it is overwhelming.    -Sticking to a routine mealtime to eat 3 meals a day and avoiding unnecessary snacks is shown to have a big role in weight control. Under normal circumstances, the only time we burn stored energy is when we are hungry, so allow  some hunger to take place- hunger means no food between appropriate meal times, only water.  It is not advisable to starve.   -It is better to avoid simple carbohydrates including:  Cakes, Sweet Desserts, Ice Cream, Soda (diet and regular), Sweet Tea, Candies, Chips, Cookies, Store Bought Juices, Alcohol in Excess of  1-2 drinks a day, Lemonade,  Artificial Sweeteners, Doughnuts, Coffee Creamers, "Sugar-free" Products, etc, etc.  This is not a complete list.....    -Consulting with certified diabetes educators is proven to provide you with the most accurate and current information on diet.  Also, you may be  interested in discussing diet options/exchanges , we can schedule a visit with Paula Oconnor, RDN, CDE for individualized nutrition education.  -Exercise: If you are able: 30 -60 minutes a day ,4 days a week, or 150 minutes of moderate intensity exercise weekly.    The longer the better if tolerated.  Combine stretch, strength, and aerobic activities.  If you were told in the past that you have high risk for cardiovascular diseases, or if you are currently symptomatic, you may seek evaluation by your heart doctor prior to initiating moderate to intense exercise programs.                                  Additional Care Considerations for Diabetes/Prediabetes   -Diabetes  is a chronic disease.  The most important care consideration is regular follow-up with your diabetes care provider with the goal being avoiding or delaying its complications and to take advantage of advances in medications and technology.  If appropriate actions are taken early enough, type 2 diabetes can even be   reversed.  Seek information from the right source.  - Whole Food, Plant Predominant Nutrition is highly recommended: Eat Plenty of vegetables, Mushrooms, fruits, Legumes, Whole Grains, Nuts, seeds in lieu of processed meats, processed snacks/pastries red meat, poultry, eggs as recommended by American College of  Lifestyle Medicine (ACLM).  -Type 2 diabetes is known to coexist with other important comorbidities such as high blood pressure and high cholesterol.  It is critical to control not only the  diabetes but also the high blood pressure and high cholesterol to minimize and delay the risk of complications including coronary artery disease, stroke, amputations, blindness, etc.  The good news is that this diet recommendation for type 2 diabetes is also very helpful for managing high cholesterol and high blood blood pressure.  - Studies showed that people with diabetes will benefit from a class of medications known as ACE inhibitors and statins.  Unless there are specific reasons not to be on these medications, the standard of care is to consider getting one from these groups of medications at an optimal doses.  These medications are generally considered safe and proven to help protect the heart and the kidneys.    - People with diabetes are encouraged to initiate and maintain regular follow-up with eye doctors, foot doctors, dentists , and if necessary heart and kidney doctors.     - It is highly recommended that people with diabetes quit smoking or stay away from smoking, and get yearly  flu vaccine and pneumonia vaccine at least every 5 years.  See above for additional recommendations on exercise, sleep, stress management , and healthy social connections.      

## 2023-03-23 NOTE — Progress Notes (Signed)
03/23/2023, 3:47 PM                   Endocrinology follow-up note  Subjective:    Patient ID: Paula Oconnor, female    DOB: [birth year], PCP Elfredia Nevins, MD   Past Medical History:  Diagnosis Date   Hyperglycemia    Schizoaffective disorder [facility])    Past Surgical History:  Procedure Laterality Date   CHOLECYSTECTOMY     Social History   Socioeconomic History   Marital status: Married    Spouse name: Not on file   Number of children: Not on file   Years of education: Not on file   Highest education level: Not on file  Occupational History   Not on file  Tobacco Use   Smoking status: Former    Types: Cigarettes   Smokeless tobacco: Never  Vaping Use   Vaping status: Never Used  Substance and Sexual Activity   Alcohol use: Never   Drug use: Never   Sexual activity: Not Currently    Birth control/protection: Condom  Other Topics Concern   Not on file  Social History Narrative   Not on file   Social Determinants of Health   Financial Resource Strain: Low Risk  (02/26/2021)   Overall Financial Resource Strain (CARDIA)    Difficulty of Paying Living Expenses: Not hard at all  Food Insecurity: No Food Insecurity (02/26/2021)   Hunger Vital Sign    Worried About Running Out of Food in the Last Year: Never true    Ran Out of Food in the Last Year: Never true  Transportation Needs: No Transportation Needs (02/26/2021)   PRAPARE - Administrator, Civil Service (Medical): No    Lack of Transportation (Non-Medical): No  Physical Activity: Inactive (02/26/2021)   Exercise Vital Sign    Days of Exercise per Week: 0 days    Minutes of Exercise per Session: 0 min  Stress: Stress Concern Present (02/26/2021)   [employer] of Occupational Health - Occupational Stress Questionnaire    Feeling of Stress : Very much  Social Connections: Socially Integrated (02/26/2021)   Social Connection and Isolation  Panel [NHANES]    Frequency of Communication with Friends and Family: More than three times a week    Frequency of Social Gatherings with Friends and Family: Once a week    Attends Religious Services: More than 4 times per year    Active Member of [employer] or Organizations: Yes    Attends Engineer, structural: More than 4 times per year    Marital Status: Married   Family History  Family history unknown: Yes   Outpatient Encounter Medications as of 03/23/2023  Medication Sig   Cholecalciferol (VITAMIN D) 50 MCG (2000 UT) CAPS Take 2,000 Units by mouth daily.   citalopram (CELEXA) 20 MG tablet Take 20 mg by mouth 2 (two) times daily.   clonazepam (KLONOPIN) 0.125 MG disintegrating tablet Take 0.125 mg by mouth 2 (two) times daily.     clotrimazole-betamethasone (LOTRISONE) cream Apply to affected area 2 times daily prn   cloZAPine (CLOZARIL) 100 MG tablet Take by mouth. Take 1 tab in the morning, 1 in the afternoon, and  4 before bedtime.   dicyclomine (BENTYL) 10 MG capsule Take 10 mg by mouth 4 (four) times daily as needed.   ferrous sulfate 325 (65 FE) MG tablet Take 325 mg by mouth daily with breakfast. Take 2 daily   Multiple Vitamin (MULTIVITAMIN) tablet Take 1 tablet by mouth daily.   topiramate (TOPAMAX) 25 MG tablet Take 25 mg by mouth 3 (three) times daily.   No facility-administered encounter medications on file as of 03/23/2023.   ALLERGIES: Allergies  Allergen Reactions   Codeine Other (See Comments)    Patient doesn't remember what happened    VACCINATION STATUS:  There is no immunization history on file for this patient.  HPI Paula Oconnor is 52 y.o. female who was seen last visit in consultation for hyperglycemia   requested by Elfredia Nevins, MD.  She is returning for follow-up.  She is on vitamin D replacement.  She did not have any further hypoglycemia or hyperglycemia.  During her last visit, point-of-care A1c was 5.2%.  She did have previsit labs  showing slight improvement in her alk phos lipid panel, still significantly above target.      She is not on any anti- diabetes medication.  -Her previous labs ruled out adrenal insufficiency, thyroid dysfunction.  She returns with steady weight since last visit.      She is on a list of medications related to her mood disorder including clozapine, clonazepam, Klonopin, risperidone.  She has history of anemia in the past however her most recent CBC showed hemoglobin of 13.6.  She is adopted, does not know any family history of diabetes.  She denies alcohol abuse, she denies any prior exposure to steroids.   Review of Systems Limited as above.    Objective:    BP 102/64   Pulse (!) 104   Ht 5\' 3"  (1.6 m)   Wt 152 lb 12.8 oz (69.3 kg)   BMI 27.07 kg/m   Wt Readings from Last 3 Encounters:  03/23/23 152 lb 12.8 oz (69.3 kg)  07/16/22 151 lb 9.6 oz (68.8 kg)  06/04/21 141 lb 3.2 oz (64 kg)    Physical Exam  Recent Results (from the past 2160 hour(s))  Lipid Panel     Status: Abnormal   Collection Time: 03/09/23  9:50 AM  Result Value Ref Range   Cholesterol, Total 191 100 - 199 mg/dL   Triglycerides 74 0 - 149 mg/dL   HDL 77 >16 mg/dL   VLDL Cholesterol Cal 14 5 - 40 mg/dL   LDL Chol Calc (NIH) 109 (H) 0 - 99 mg/dL   Chol/HDL Ratio 2.5 0.0 - 4.4 ratio    Comment:                                   T. Chol/HDL Ratio                                             Men  Women                               1/2 Avg.Risk  3.4    3.3  Avg.Risk  5.0    4.4                                2X Avg.Risk  9.6    7.1                                3X Avg.Risk 23.4   11.0   Vitamin D, 25-hydroxy     Status: None   Collection Time: 03/09/23  9:50 AM  Result Value Ref Range   Vit D, 25-Hydroxy 36.3 30.0 - 100.0 ng/mL    Comment: Vitamin D deficiency has been defined by the Institute of Medicine and an Endocrine Society practice guideline as a level of  serum 25-OH vitamin D less than 20 ng/mL (1,2). The Endocrine Society went on to further define vitamin D insufficiency as a level between 21 and 29 ng/mL (2). 1. IOM (Institute of Medicine). 2010. Dietary reference    intakes for calcium and D. [place]: The    [employer]. 2. Holick MF, Binkley Austin, Bischoff-Ferrari HA, et al.    Evaluation, treatment, and prevention of vitamin D    deficiency: an Endocrine Society clinical practice    guideline. JCEM. 2011 Jul; 96(7):1911-30.   Comprehensive metabolic panel     Status: Abnormal   Collection Time: 03/09/23  9:50 AM  Result Value Ref Range   Glucose 160 (H) 70 - 99 mg/dL   BUN 17 6 - 24 mg/dL   Creatinine, Ser 8.65 (H) 0.57 - 1.00 mg/dL   eGFR 67 >78 IO/NGE/9.52   BUN/Creatinine Ratio 17 9 - 23   Sodium 144 134 - 144 mmol/L   Potassium 3.8 3.5 - 5.2 mmol/L   Chloride 110 (H) 96 - 106 mmol/L   CO2 23 20 - 29 mmol/L   Calcium 9.2 8.7 - 10.2 mg/dL   Total Protein 6.2 6.0 - 8.5 g/dL   Albumin 4.2 3.8 - 4.9 g/dL   Globulin, Total 2.0 1.5 - 4.5 g/dL   Bilirubin Total <8.4 0.0 - 1.2 mg/dL   Alkaline Phosphatase 215 (H) 44 - 121 IU/L   AST 19 0 - 40 IU/L   ALT 22 0 - 32 IU/L      Assessment & Plan:   1.  Vitamin D deficiency--replete  2.  Dyslipidemia, elevated alkaline phosphatase 3.  Vitamin D deficiency 4.  Impaired glucose tolerance  -She was previously worked up for thyroid and adrenal function with normal findings.   -Her recent CMP showed glucose of 160, on a nonfasting sample. -She is very returns with vitamin D replete at 25, advised to continue vitamin D3 2000 units daily.     She previously had  random hyperglycemia .  - she acknowledges that there is a room for improvement in her food and drink choices. - Suggestion is made for her to avoid simple carbohydrates  from her diet including Cakes, Sweet Desserts, Ice Cream, Soda (diet and regular), Sweet Tea, Candies, Chips, Cookies, Store Bought  Juices, Alcohol , Artificial Sweeteners,  Coffee Creamer, and "Sugar-free" Products, Lemonade. This will help patient to have more stable blood glucose profile and potentially avoid unintended weight gain.  The following Lifestyle Medicine recommendations according to American College of Lifestyle Medicine  [facility]) were discussed and and offered to patient and she  agrees to start the journey:  A. Whole  Foods, Plant-Based Nutrition comprising of fruits and vegetables, plant-based proteins, whole-grain carbohydrates was discussed in detail with the patient.   A list for source of those nutrients were also provided to the patient.  Patient will use only water or unsweetened tea for hydration. B.  The need to stay away from risky substances including alcohol, smoking; obtaining 7 to 9 hours of restorative sleep, at least 150 minutes of moderate intensity exercise weekly, the importance of healthy social connections,  and stress management techniques were discussed. C.  A full color page of  Calorie density of various food groups per pound showing examples of each food groups was provided to the patient.  -In light of her persistent elevated alk phos, and hyperlipidemia she is referred liver ultrasound before her next visit.   -Screening for celiac disease is negative.    - I advised her  to maintain close follow up with Elfredia Nevins, MD for primary care needs.   I spent  25  minutes in the care of the patient today including review of labs from Thyroid Function, CMP, and other relevant labs ; imaging/biopsy records (current and previous including abstractions from other facilities); face-to-face time discussing  her lab results and symptoms, medications doses, her options of short and long term treatment based on the latest standards of care / guidelines;   and documenting the encounter.  Paula Oconnor  participated in the discussions, expressed understanding, and voiced agreement with the  above plans.  All questions were answered to her satisfaction. she is encouraged to contact clinic should she have any questions or concerns prior to her return visit.   Follow up plan: Return in about 3 months (around 06/22/2023), or and abdominal ultrasound, for Fasting Labs  in AM B4 8, A1c -NV.   Marquis Lunch, MD [facility] [address] [place], [place] [address] Phone: [phone]  Fax: [phone]     03/23/2023, 3:47 PM  This note was partially dictated with voice recognition software. Similar sounding words can be transcribed inadequately or may not  be corrected upon review.

## 2023-04-05 ENCOUNTER — Ambulatory Visit (HOSPITAL_COMMUNITY)
Admission: RE | Admit: 2023-04-05 | Discharge: 2023-04-05 | Disposition: A | Discharge status: Home / Self Care | Payer: 59 | Source: Ambulatory Visit | Attending: "Endocrinology | Admitting: "Endocrinology

## 2023-04-05 DIAGNOSIS — R748 Abnormal levels of other serum enzymes: Secondary | ICD-10-CM | POA: Insufficient documentation

## 2023-04-12 ENCOUNTER — Encounter: Payer: Self-pay | Admitting: Adult Health

## 2023-04-12 ENCOUNTER — Ambulatory Visit: Payer: 59 | Admitting: Adult Health

## 2023-04-12 VITALS — BP 117/73 | HR 94 | Ht 63.0 in | Wt 151.5 lb

## 2023-04-12 DIAGNOSIS — R232 Flushing: Secondary | ICD-10-CM

## 2023-04-12 DIAGNOSIS — R635 Abnormal weight gain: Secondary | ICD-10-CM

## 2023-04-12 DIAGNOSIS — Z01419 Encounter for gynecological examination (general) (routine) without abnormal findings: Secondary | ICD-10-CM | POA: Insufficient documentation

## 2023-04-12 DIAGNOSIS — Z0000 Encounter for general adult medical examination without abnormal findings: Secondary | ICD-10-CM

## 2023-04-12 DIAGNOSIS — Z780 Asymptomatic menopausal state: Secondary | ICD-10-CM

## 2023-04-12 DIAGNOSIS — N951 Menopausal and female climacteric states: Secondary | ICD-10-CM | POA: Insufficient documentation

## 2023-04-12 DIAGNOSIS — Z1211 Encounter for screening for malignant neoplasm of colon: Secondary | ICD-10-CM

## 2023-04-12 DIAGNOSIS — N926 Irregular menstruation, unspecified: Secondary | ICD-10-CM | POA: Diagnosis not present

## 2023-04-12 DIAGNOSIS — Z1331 Encounter for screening for depression: Secondary | ICD-10-CM | POA: Diagnosis not present

## 2023-04-12 DIAGNOSIS — Z1212 Encounter for screening for malignant neoplasm of rectum: Secondary | ICD-10-CM

## 2023-04-12 DIAGNOSIS — K760 Fatty (change of) liver, not elsewhere classified: Secondary | ICD-10-CM

## 2023-04-12 NOTE — Progress Notes (Signed)
Patient ID: Paula Oconnor, female   DOB: May 13, 1971, 52 y.o.   MRN: [id number] History of Present Illness: Paula Oconnor is a 52 year old white female,married, G1P0010, in for a well woman gyn exam. She has not had a period since 06/12/22, has some hot flashes, weight gain and not sleeping as well. She sees Dr Fransico Him and has fatty liver.  She is adopted.     Component Value Date/Time   DIAGPAP  02/26/2021 1344    - Negative for intraepithelial lesion or malignancy (NILM)   HPVHIGH Negative 02/26/2021 1344   ADEQPAP  02/26/2021 1344    Satisfactory for evaluation; transformation zone component PRESENT.    PCP is Dr Sherwood Gambler.   Current Medications, Allergies, Past Medical History, Past Surgical History, Family History and Social History were reviewed in [employer] electronic medical record.     Review of Systems: Patient denies any headaches, hearing loss, fatigue, blurred vision, shortness of breath, chest pain, abdominal pain, problems with bowel movements, urination, or intercourse(not active). No joint pain or mood swings.  See HPI for positives   Physical Exam:BP 117/73 (BP Location: Left Arm, Patient Position: Sitting, Cuff Size: Normal)   Pulse 94   Ht 5\' 3"  (1.6 m)   Wt 151 lb 8 oz (68.7 kg)   LMP 06/12/2022 (Exact Date)   BMI 26.84 kg/m   General:  Well developed, well nourished, no acute distress Skin:  Warm and dry Neck:  Midline trachea, normal thyroid, good ROM, no lymphadenopathy Lungs; Clear to auscultation bilaterally Breast:  No dominant palpable mass, retraction, or nipple discharge Cardiovascular: Regular rate and rhythm Abdomen:  Soft, non tender, no hepatosplenomegaly Pelvic:  External genitalia is normal in appearance, no lesions.  The vagina is pale pink. Urethra has no lesions or masses. The cervix is smooth.  Uterus is felt to be normal size, shape, and contour.  No adnexal masses or tenderness noted.Bladder is non tender, no masses felt. Rectal: Deferred at  her request Extremities/musculoskeletal:  No swelling or varicosities noted, no clubbing or cyanosis Psych:  No mood changes, alert and cooperative,seems happy AA is 0 Fall risk is low    04/12/2023    3:49 PM 02/26/2021    1:36 PM  Depression screen PHQ 2/9  Decreased Interest 2 2  Down, Depressed, Hopeless 1   PHQ - 2 Score 3 2  Altered sleeping 2 1  Tired, decreased energy 2 2  Change in appetite 1 0  Feeling bad or failure about yourself  3 2  Trouble concentrating 1 2  Moving slowly or fidgety/restless 1 1  Suicidal thoughts 0 0  PHQ-9 Score 13 10   She is on meds    04/12/2023    3:49 PM 02/26/2021    1:37 PM  GAD 7 : Generalized Anxiety Score  Nervous, Anxious, on Edge 3 3  Control/stop worrying 2 3  Worry too much - different things 2 3  Trouble relaxing 3 3  Restless 1 3  Easily annoyed or irritable 1 3  Afraid - awful might happen 3 3  Total GAD 7 Score 15 21    Upstream - 04/12/23 1546       Pregnancy Intention Screening   Does the patient want to become pregnant in the next year? No    Does the patient's partner want to become pregnant in the next year? No    Would the patient like to discuss contraceptive options today? No      Contraception  Wrap Up   Current Method Abstinence    End Method Abstinence   Female condom if has sex   Contraception Counseling Provided No            Examination chaperoned by Freddie Apley RN and [place] Scearce NP student     Impression and Plan: 1. Encounter for well woman exam with routine gynecological exam Pap and physical in 1 year Mammogram was negative 03/01/23 Labs with Dr Fransico Him  2. Perimenopause  3. Hot flashes +Hot flashes  4. Weight gain Has gained about 30 lbs in last year She says she is walking and trying to eat more fruits and veggies and drinks unsweet tea   5. Missed periods No period since 06/12/22  6. Screening for colorectal cancer Referred to [facility] for colonoscopy  - Ambulatory referral to  Gastroenterology  7. Fatty liver Review handouts on fatty liver and diet   8. Menopause Will not be postmenopause til no period for 1 whole year

## 2023-04-13 ENCOUNTER — Encounter: Payer: Self-pay | Admitting: *Deleted

## 2023-07-17 LAB — ALKALINE PHOSPHATASE, BONE SPECIFIC

## 2023-07-20 LAB — COMPREHENSIVE METABOLIC PANEL
ALT: 32 [IU]/L (ref 0–32)
AST: 18 [IU]/L (ref 0–40)
Albumin: 4.2 g/dL (ref 3.8–4.9)
Alkaline Phosphatase: 259 [IU]/L — ABNORMAL HIGH (ref 44–121)
BUN/Creatinine Ratio: 17 (ref 9–23)
BUN: 22 mg/dL (ref 6–24)
Bilirubin Total: 0.3 mg/dL (ref 0.0–1.2)
CO2: 22 mmol/L (ref 20–29)
Calcium: 9.2 mg/dL (ref 8.7–10.2)
Chloride: 109 mmol/L — ABNORMAL HIGH (ref 96–106)
Creatinine, Ser: 1.26 mg/dL — ABNORMAL HIGH (ref 0.57–1.00)
Globulin, Total: 2 g/dL (ref 1.5–4.5)
Glucose: 105 mg/dL — ABNORMAL HIGH (ref 70–99)
Potassium: 3.9 mmol/L (ref 3.5–5.2)
Sodium: 142 mmol/L (ref 134–144)
Total Protein: 6.2 g/dL (ref 6.0–8.5)
eGFR: 51 mL/min/{1.73_m2} — ABNORMAL LOW (ref >59)

## 2023-07-20 LAB — GAMMA GT: GGT: 13 [IU]/L (ref 0–60)

## 2023-07-20 LAB — TSH: TSH: 2.53 u[IU]/mL (ref 0.450–4.500)

## 2023-07-20 LAB — LIPID PANEL
Chol/HDL Ratio: 2.5 {ratio} (ref 0.0–4.4)
Cholesterol, Total: 179 mg/dL (ref 100–199)
HDL: 71 mg/dL (ref >39)
LDL Chol Calc (NIH): 94 mg/dL (ref 0–99)
Triglycerides: 73 mg/dL (ref 0–149)
VLDL Cholesterol Cal: 14 mg/dL (ref 5–40)

## 2023-07-20 LAB — ALKALINE PHOSPHATASE, BONE SPECIFIC: Tandem-R Ostase: 59.1 ug/L

## 2023-07-20 LAB — T4, FREE: Free T4: 1 ng/dL (ref 0.82–1.77)

## 2023-07-27 ENCOUNTER — Encounter: Payer: Self-pay | Admitting: "Endocrinology

## 2023-07-27 ENCOUNTER — Ambulatory Visit (INDEPENDENT_AMBULATORY_CARE_PROVIDER_SITE_OTHER): Payer: 59 | Admitting: "Endocrinology

## 2023-07-27 VITALS — BP 110/72 | HR 96 | Ht 63.0 in | Wt 142.6 lb

## 2023-07-27 DIAGNOSIS — E559 Vitamin D deficiency, unspecified: Secondary | ICD-10-CM | POA: Diagnosis not present

## 2023-07-27 DIAGNOSIS — R7302 Impaired glucose tolerance (oral): Secondary | ICD-10-CM | POA: Diagnosis not present

## 2023-07-27 DIAGNOSIS — E782 Mixed hyperlipidemia: Secondary | ICD-10-CM

## 2023-07-27 DIAGNOSIS — R739 Hyperglycemia, unspecified: Secondary | ICD-10-CM

## 2023-07-27 DIAGNOSIS — R748 Abnormal levels of other serum enzymes: Secondary | ICD-10-CM | POA: Diagnosis not present

## 2023-07-27 LAB — POCT GLYCOSYLATED HEMOGLOBIN (HGB A1C): HbA1c, POC (controlled diabetic range): 5.5 % (ref 0.0–7.0)

## 2023-07-27 NOTE — Progress Notes (Signed)
 07/27/2023, 5:19 PM                   Endocrinology follow-up note  Subjective:    Patient ID: Paula Oconnor, female    DOB: [birth year], PCP Bertell Satterfield, MD   Past Medical History:  Diagnosis Date   Fatty liver    Heel spur, left    Hypercholesteremia    Hyperglycemia    Plantar fasciitis of left foot    Schizoaffective disorder (HCC)    Past Surgical History:  Procedure Laterality Date   CHOLECYSTECTOMY     Social History   Socioeconomic History   Marital status: Married    Spouse name: Not on file   Number of children: 0   Years of education: Not on file   Highest education level: Not on file  Occupational History   Not on file  Tobacco Use   Smoking status: Former    Types: Cigarettes   Smokeless tobacco: Never  Vaping Use   Vaping status: Never Used  Substance and Sexual Activity   Alcohol use: Not Currently   Drug use: Never   Sexual activity: Not Currently    Birth control/protection: Condom, Abstinence  Other Topics Concern   Not on file  Social History Narrative   Not on file   Social Drivers of Health   Financial Resource Strain: Low Risk  (02/26/2021)   Overall Financial Resource Strain (CARDIA)    Difficulty of Paying Living Expenses: Not hard at all  Food Insecurity: No Food Insecurity (04/12/2023)   Hunger Vital Sign    Worried About Running Out of Food in the Last Year: Never true    Ran Out of Food in the Last Year: Never true  Transportation Needs: No Transportation Needs (04/12/2023)   PRAPARE - Administrator, Civil Service (Medical): No    Lack of Transportation (Non-Medical): No  Physical Activity: Insufficiently Active (04/12/2023)   Exercise Vital Sign    Days of Exercise per Week: 3 days    Minutes of Exercise per Session: 20 min  Stress: Stress Concern Present (04/12/2023)   [employer] of Occupational Health - Occupational Stress Questionnaire     Feeling of Stress : Very much  Social Connections: Socially Integrated (04/12/2023)   Social Connection and Isolation Panel [NHANES]    Frequency of Communication with Friends and Family: More than three times a week    Frequency of Social Gatherings with Friends and Family: Three times a week    Attends Religious Services: More than 4 times per year    Active Member of Clubs or Organizations: Yes    Attends Engineer, Structural: More than 4 times per year    Marital Status: Married   Family History  Adopted: Yes  Family history unknown: Yes   Outpatient Encounter Medications as of 07/27/2023  Medication Sig   Cholecalciferol (VITAMIN D ) 50 MCG (2000 UT) CAPS Take 2,000 Units by mouth daily.   citalopram (CELEXA) 20 MG tablet Take 20 mg by mouth 2 (two) times daily.   clonazepam (KLONOPIN) 0.125 MG disintegrating tablet Take 0.125 mg by mouth 2 (two) times daily.     clotrimazole -betamethasone  (LOTRISONE ) cream Apply  to affected area 2 times daily prn (Patient not taking: Reported on 04/12/2023)   cloZAPine (CLOZARIL) 100 MG tablet Take by mouth. Take 1 tab in the morning, 1 in the afternoon, and 4 before bedtime.   dicyclomine  (BENTYL ) 10 MG capsule Take 10 mg by mouth 4 (four) times daily as needed.   ferrous sulfate 325 (65 FE) MG tablet Take 325 mg by mouth daily with breakfast. Take 2 daily   Multiple Vitamin (MULTIVITAMIN) tablet Take 1 tablet by mouth daily.   topiramate (TOPAMAX) 25 MG tablet Take 25 mg by mouth 3 (three) times daily.   No facility-administered encounter medications on file as of 07/27/2023.   ALLERGIES: Allergies  Allergen Reactions   Codeine Other (See Comments)    Patient doesn't remember what happened    VACCINATION STATUS:  There is no immunization history on file for this patient.  HPI Paula Oconnor is 53 y.o. female who was seen last visit in consultation for hyperglycemia   requested by Bertell Satterfield, MD.  She is returning for  follow-up.  She is on vitamin D  replacement.  She did not have any further hypoglycemia or hyperglycemia.  Her point-of-care A1c today is 5.5%.  She did have her previsit labs showing persistently elevated alk phos,   Improving lipid panel.  She is known to have hepatic steatosis. She is following more or less whole food plant-based diet.    She is not on any anti- diabetes medication.  -Her previous labs ruled out adrenal insufficiency, thyroid  dysfunction.  She returns with steady weight since last visit.      She is on a list of medications related to her mood disorder including clozapine, clonazepam, Klonopin, risperidone.  She has history of anemia in the past however her most recent CBC showed hemoglobin of 13.6.  She is adopted, does not know any family history of diabetes.  She denies alcohol abuse, she denies any prior exposure to steroids.   Review of Systems Limited as above.    Objective:    BP 110/72   Pulse 96   Ht 5' 3 (1.6 m)   Wt 142 lb 9.6 oz (64.7 kg)   BMI 25.26 kg/m   Wt Readings from Last 3 Encounters:  07/27/23 142 lb 9.6 oz (64.7 kg)  04/12/23 151 lb 8 oz (68.7 kg)  03/23/23 152 lb 12.8 oz (69.3 kg)    Physical Exam  Recent Results (from the past 2160 hours)  Comprehensive metabolic panel     Status: Abnormal   Collection Time: 07/16/23  8:21 AM  Result Value Ref Range   Glucose 105 (H) 70 - 99 mg/dL   BUN 22 6 - 24 mg/dL   Creatinine, Ser 8.73 (H) 0.57 - 1.00 mg/dL   eGFR 51 (L) >40 fO/fpw/8.26   BUN/Creatinine Ratio 17 9 - 23   Sodium 142 134 - 144 mmol/L   Potassium 3.9 3.5 - 5.2 mmol/L   Chloride 109 (H) 96 - 106 mmol/L   CO2 22 20 - 29 mmol/L   Calcium 9.2 8.7 - 10.2 mg/dL   Total Protein 6.2 6.0 - 8.5 g/dL   Albumin 4.2 3.8 - 4.9 g/dL   Globulin, Total 2.0 1.5 - 4.5 g/dL   Bilirubin Total 0.3 0.0 - 1.2 mg/dL   Alkaline Phosphatase 259 (H) 44 - 121 IU/L   AST 18 0 - 40 IU/L   ALT 32 0 - 32 IU/L  Gamma GT     Status: None  Collection Time: 07/16/23  8:21 AM  Result Value Ref Range   GGT 13 0 - 60 IU/L  Lipid panel     Status: None   Collection Time: 07/16/23  8:21 AM  Result Value Ref Range   Cholesterol, Total 179 100 - 199 mg/dL   Triglycerides 73 0 - 149 mg/dL   HDL 71 >60 mg/dL   VLDL Cholesterol Cal 14 5 - 40 mg/dL   LDL Chol Calc (NIH) 94 0 - 99 mg/dL   Chol/HDL Ratio 2.5 0.0 - 4.4 ratio    Comment:                                   T. Chol/HDL Ratio                                             Men  Women                               1/2 Avg.Risk  3.4    3.3                                   Avg.Risk  5.0    4.4                                2X Avg.Risk  9.6    7.1                                3X Avg.Risk 23.4   11.0   TSH     Status: None   Collection Time: 07/16/23  8:21 AM  Result Value Ref Range   TSH 2.530 0.450 - 4.500 uIU/mL  T4, free     Status: None   Collection Time: 07/16/23  8:21 AM  Result Value Ref Range   Free T4 1.00 0.82 - 1.77 ng/dL  Alkaline phosphatase, bone specific     Status: None   Collection Time: 07/16/23  8:21 AM  Result Value Ref Range   Tandem-R Ostase 59.1 ug/L    Comment:          Premenopausal Women:               6.0 - 22.7          Postmenopausal Women:              8.1 - 31.6   HgB A1c     Status: None   Collection Time: 07/27/23  3:00 PM  Result Value Ref Range   Hemoglobin A1C     HbA1c POC (<> result, manual entry)     HbA1c, POC (prediabetic range)     HbA1c, POC (controlled diabetic range) 5.5 0.0 - 7.0 %      Assessment & Plan:   1.  Vitamin D  deficiency--replete  2.  Dyslipidemia, elevated alkaline phosphatase 3.  Vitamin D  deficiency 4.  Impaired glucose tolerance  -She was previously worked up for thyroid  and adrenal function with normal findings.   -Her recent CMP showed glucose of  106, point-of-care A1c of 5.5%.  She will not need intervention for glycemic excursion at this time.    -She is  vitamin D  replete at 36, advised  to continue vitamin D3 2000 units daily.     She previously had  random hyperglycemia .  - she acknowledges that there is a room for improvement in her food and drink choices. - Suggestion is made for her to avoid simple carbohydrates  from her diet including Cakes, Sweet Desserts, Ice Cream, Soda (diet and regular), Sweet Tea, Candies, Chips, Cookies, Store Bought Juices, Alcohol , Artificial Sweeteners,  Coffee Creamer, and Sugar-free Products, Lemonade. This will help patient to have more stable blood glucose profile and potentially avoid unintended weight gain.  The following Lifestyle Medicine recommendations according to American College of Lifestyle Medicine  [facility]) were discussed and and offered to patient and she  agrees to start the journey:  A. Whole Foods, Plant-Based Nutrition comprising of fruits and vegetables, plant-based proteins, whole-grain carbohydrates was discussed in detail with the patient.   A list for source of those nutrients were also provided to the patient.  Patient will use only water or unsweetened tea for hydration. B.  The need to stay away from risky substances including alcohol, smoking; obtaining 7 to 9 hours of restorative sleep, at least 150 minutes of moderate intensity exercise weekly, the importance of healthy social connections,  and stress management techniques were discussed. C.  A full color page of  Calorie density of various food groups per pound showing examples of each food groups was provided to the patient.    -In light of her persistent elevated alk phos, and hyperlipidemia she is status post liver ultrasound which showed hepatic steatosis.  Her alk phos is persistently elevated, will be offered bone scan to rule out Paget's disease.  If this study is negative, she will be referred to gastroenterology.  -Screening for celiac disease is negative.    - I advised her  to maintain close follow up with Bertell Satterfield, MD for primary care  needs.   I spent  25  minutes in the care of the patient today including review of labs from Thyroid  Function, CMP, and other relevant labs ; imaging/biopsy records (current and previous including abstractions from other facilities); face-to-face time discussing  her lab results and symptoms, medications doses, her options of short and long term treatment based on the latest standards of care / guidelines;   and documenting the encounter.  Paula Oconnor  participated in the discussions, expressed understanding, and voiced agreement with the above plans.  All questions were answered to her satisfaction. she is encouraged to contact clinic should she have any questions or concerns prior to her return visit.    Follow up plan: Return in about 4 weeks (around 08/24/2023), or with her Bone Scan Results.   Ranny Earl, MD [facility] [address] [place], [place] [address] Phone: [phone]  Fax: [phone]     07/27/2023, 5:19 PM  This note was partially dictated with voice recognition software. Similar sounding words can be transcribed inadequately or may not  be corrected upon review.

## 2023-07-28 ENCOUNTER — Telehealth: Payer: Self-pay

## 2023-07-28 NOTE — Telephone Encounter (Signed)
 Pt called stating she drinks a lot of unsweetened tea with no artificial sweeteners. Pt asked if this would affect her liver function.

## 2023-07-28 NOTE — Telephone Encounter (Signed)
 Left a message requesting pt return call to the office.

## 2023-07-29 NOTE — Telephone Encounter (Signed)
Pt made aware

## 2023-08-18 ENCOUNTER — Ambulatory Visit (HOSPITAL_COMMUNITY)
Admission: RE | Admit: 2023-08-18 | Discharge: 2023-08-18 | Disposition: A | Discharge status: Home / Self Care | Payer: 59 | Source: Ambulatory Visit | Attending: "Endocrinology | Admitting: "Endocrinology

## 2023-08-18 DIAGNOSIS — R748 Abnormal levels of other serum enzymes: Secondary | ICD-10-CM | POA: Insufficient documentation

## 2023-08-18 MED ORDER — TECHNETIUM TC 99M MEDRONATE IV KIT
20.0000 | PACK | Freq: Once | INTRAVENOUS | Status: AC | PRN
  Administered 2023-08-18: 20 via INTRAVENOUS

## 2023-08-25 ENCOUNTER — Ambulatory Visit: Payer: 59 | Admitting: "Endocrinology

## 2023-08-26 ENCOUNTER — Telehealth: Payer: Self-pay | Admitting: Internal Medicine

## 2023-08-26 NOTE — Telephone Encounter (Signed)
Kara Mead at [facility] asked that we send pt another letter.  Never received the first one back to get scheduled for colonoscopy.

## 2023-08-30 ENCOUNTER — Ambulatory Visit: Payer: 59 | Admitting: "Endocrinology

## 2023-08-31 ENCOUNTER — Encounter: Payer: Self-pay | Admitting: "Endocrinology

## 2023-08-31 ENCOUNTER — Ambulatory Visit (INDEPENDENT_AMBULATORY_CARE_PROVIDER_SITE_OTHER): Payer: 59 | Admitting: "Endocrinology

## 2023-08-31 VITALS — BP 106/64 | HR 100 | Ht 63.0 in | Wt 143.0 lb

## 2023-08-31 DIAGNOSIS — R748 Abnormal levels of other serum enzymes: Secondary | ICD-10-CM | POA: Diagnosis not present

## 2023-08-31 DIAGNOSIS — E782 Mixed hyperlipidemia: Secondary | ICD-10-CM

## 2023-08-31 DIAGNOSIS — R7302 Impaired glucose tolerance (oral): Secondary | ICD-10-CM | POA: Diagnosis not present

## 2023-08-31 NOTE — Progress Notes (Signed)
08/31/2023, 3:26 PM                   Endocrinology follow-up note  Subjective:    Patient ID: Paula Oconnor, female    DOB: 1970/07/30, PCP Elfredia Nevins, MD   Past Medical History:  Diagnosis Date   Fatty liver    Heel spur, left    Hypercholesteremia    Hyperglycemia    Plantar fasciitis of left foot    Schizoaffective disorder (HCC)    Past Surgical History:  Procedure Laterality Date   CHOLECYSTECTOMY     Social History   Socioeconomic History   Marital status: Married    Spouse name: Not on file   Number of children: 0   Years of education: Not on file   Highest education level: Not on file  Occupational History   Not on file  Tobacco Use   Smoking status: Former    Types: Cigarettes   Smokeless tobacco: Never  Vaping Use   Vaping status: Never Used  Substance and Sexual Activity   Alcohol use: Not Currently   Drug use: Never   Sexual activity: Not Currently    Birth control/protection: Condom, Abstinence  Other Topics Concern   Not on file  Social History Narrative   Not on file   Social Drivers of Health   Financial Resource Strain: Low Risk  (02/26/2021)   Overall Financial Resource Strain (CARDIA)    Difficulty of Paying Living Expenses: Not hard at all  Food Insecurity: No Food Insecurity (04/12/2023)   Hunger Vital Sign    Worried About Running Out of Food in the Last Year: Never true    Ran Out of Food in the Last Year: Never true  Transportation Needs: No Transportation Needs (04/12/2023)   PRAPARE - Administrator, Civil Service (Medical): No    Lack of Transportation (Non-Medical): No  Physical Activity: Insufficiently Active (04/12/2023)   Exercise Vital Sign    Days of Exercise per Week: 3 days    Minutes of Exercise per Session: 20 min  Stress: Stress Concern Present (04/12/2023)   [employer] of Occupational Health - Occupational Stress Questionnaire     Feeling of Stress : Very much  Social Connections: Socially Integrated (04/12/2023)   Social Connection and Isolation Panel [NHANES]    Frequency of Communication with Friends and Family: More than three times a week    Frequency of Social Gatherings with Friends and Family: Three times a week    Attends Religious Services: More than 4 times per year    Active Member of Clubs or Organizations: Yes    Attends Engineer, structural: More than 4 times per year    Marital Status: Married   Family History  Adopted: Yes  Family history unknown: Yes   Outpatient Encounter Medications as of 08/31/2023  Medication Sig   Cholecalciferol (VITAMIN D) 50 MCG (2000 UT) CAPS Take 2,000 Units by mouth daily.   citalopram (CELEXA) 20 MG tablet Take 20 mg by mouth 2 (two) times daily.   clonazepam (KLONOPIN) 0.125 MG disintegrating tablet Take 0.125 mg by mouth 2 (two) times daily.     clotrimazole-betamethasone (LOTRISONE) cream Apply  to affected area 2 times daily prn (Patient not taking: Reported on 04/12/2023)   cloZAPine (CLOZARIL) 100 MG tablet Take by mouth. Take 1 tab in the morning, 1 in the afternoon, and 4 before bedtime.   dicyclomine (BENTYL) 10 MG capsule Take 10 mg by mouth 4 (four) times daily as needed.   ferrous sulfate 325 (65 FE) MG tablet Take 325 mg by mouth daily with breakfast. Take 2 daily   Multiple Vitamin (MULTIVITAMIN) tablet Take 1 tablet by mouth daily.   topiramate (TOPAMAX) 25 MG tablet Take 25 mg by mouth 3 (three) times daily.   No facility-administered encounter medications on file as of 08/31/2023.   ALLERGIES: Allergies  Allergen Reactions   Codeine Other (See Comments)    Patient doesn't remember what happened    VACCINATION STATUS:  There is no immunization history on file for this patient.  HPI Paula Oconnor is 53 y.o. female who was seen last visit in consultation for hyperglycemia   requested by Elfredia Nevins, MD.  She is returning for  follow-up.  She is on vitamin D replacement.  She did not have any further hypoglycemia or hyperglycemia.  Her recent point-of-care A1c was 5.5%.   She did have her previsit labs showing persistently elevated alk phos,   Improving lipid panel.  She is known to have hepatic steatosis. She is following more or less whole food plant-based diet. -Her previsit bone scan was negative for any abnormal tracer activity.    She is not on any anti- diabetes medication, however has taken Topamax for years.  -Her previous labs ruled out adrenal insufficiency, thyroid dysfunction.  She returns with steady weight since last visit.      She is on a list of medications related to her mood disorder including clozapine, clonazepam, Klonopin, risperidone.  She has history of anemia in the past however her most recent CBC showed hemoglobin of 13.6.  She is adopted, does not know any family history of diabetes.  She denies alcohol abuse, she denies any prior exposure to steroids.   Review of Systems Limited as above.    Objective:    BP 106/64   Pulse 100   Ht 5\' 3"  (1.6 m)   Wt 143 lb (64.9 kg)   BMI 25.33 kg/m   Wt Readings from Last 3 Encounters:  08/31/23 143 lb (64.9 kg)  07/27/23 142 lb 9.6 oz (64.7 kg)  04/12/23 151 lb 8 oz (68.7 kg)    Physical Exam  Recent Results (from the past 2160 hours)  Comprehensive metabolic panel     Status: Abnormal   Collection Time: 07/16/23  8:21 AM  Result Value Ref Range   Glucose 105 (H) 70 - 99 mg/dL   BUN 22 6 - 24 mg/dL   Creatinine, Ser 1.61 (H) 0.57 - 1.00 mg/dL   eGFR 51 (L) >09 UE/AVW/0.98   BUN/Creatinine Ratio 17 9 - 23   Sodium 142 134 - 144 mmol/L   Potassium 3.9 3.5 - 5.2 mmol/L   Chloride 109 (H) 96 - 106 mmol/L   CO2 22 20 - 29 mmol/L   Calcium 9.2 8.7 - 10.2 mg/dL   Total Protein 6.2 6.0 - 8.5 g/dL   Albumin 4.2 3.8 - 4.9 g/dL   Globulin, Total 2.0 1.5 - 4.5 g/dL   Bilirubin Total 0.3 0.0 - 1.2 mg/dL   Alkaline Phosphatase 259  (H) 44 - 121 IU/L   AST 18 0 - 40 IU/L   ALT  32 0 - 32 IU/L  Gamma GT     Status: None   Collection Time: 07/16/23  8:21 AM  Result Value Ref Range   GGT 13 0 - 60 IU/L  Lipid panel     Status: None   Collection Time: 07/16/23  8:21 AM  Result Value Ref Range   Cholesterol, Total 179 100 - 199 mg/dL   Triglycerides 73 0 - 149 mg/dL   HDL 71 >16 mg/dL   VLDL Cholesterol Cal 14 5 - 40 mg/dL   LDL Chol Calc (NIH) 94 0 - 99 mg/dL   Chol/HDL Ratio 2.5 0.0 - 4.4 ratio    Comment:                                   T. Chol/HDL Ratio                                             Men  Women                               1/2 Avg.Risk  3.4    3.3                                   Avg.Risk  5.0    4.4                                2X Avg.Risk  9.6    7.1                                3X Avg.Risk 23.4   11.0   TSH     Status: None   Collection Time: 07/16/23  8:21 AM  Result Value Ref Range   TSH 2.530 0.450 - 4.500 uIU/mL  T4, free     Status: None   Collection Time: 07/16/23  8:21 AM  Result Value Ref Range   Free T4 1.00 0.82 - 1.77 ng/dL  Alkaline phosphatase, bone specific     Status: None   Collection Time: 07/16/23  8:21 AM  Result Value Ref Range   Tandem-R Ostase 59.1 ug/L    Comment:          Premenopausal Women:               6.0 - 22.7          Postmenopausal Women:              8.1 - 31.6   HgB A1c     Status: None   Collection Time: 07/27/23  3:00 PM  Result Value Ref Range   Hemoglobin A1C     HbA1c POC (<> result, manual entry)     HbA1c, POC (prediabetic range)     HbA1c, POC (controlled diabetic range) 5.5 0.0 - 7.0 %      Assessment & Plan:   1.  Vitamin D deficiency--replete  2.  Dyslipidemia, elevated alkaline phosphatase 3.  Vitamin D deficiency 4.  Impaired glucose tolerance  -She was previously worked up  for thyroid and adrenal function with normal findings.   -Her recent CMP showed glucose of 106, point-of-care A1c of 5.5%.  She will not need  intervention for glycemic excursion at this time.    -She is  vitamin D replete at 36, advised to continue vitamin D3 2000 units daily.     She previously had  random hyperglycemia . In light of her nonalcoholic fatty liver disease, she starts to benefit from whole food plant-based diet. - she acknowledges that there is a room for improvement in her food and drink choices. - Suggestion is made for her to avoid simple carbohydrates  from her diet including Cakes, Sweet Desserts, Ice Cream, Soda (diet and regular), Sweet Tea, Candies, Chips, Cookies, Store Bought Juices, Alcohol , Artificial Sweeteners,  Coffee Creamer, and "Sugar-free" Products, Lemonade. This will help patient to have more stable blood glucose profile and potentially avoid unintended weight gain.  The following Lifestyle Medicine recommendations according to American College of Lifestyle Medicine  [facility]) were discussed and and offered to patient and she  agrees to start the journey:  A. Whole Foods, Plant-Based Nutrition comprising of fruits and vegetables, plant-based proteins, whole-grain carbohydrates was discussed in detail with the patient.   A list for source of those nutrients were also provided to the patient.  Patient will use only water or unsweetened tea for hydration. B.  The need to stay away from risky substances including alcohol, smoking; obtaining 7 to 9 hours of restorative sleep, at least 150 minutes of moderate intensity exercise weekly, the importance of healthy social connections,  and stress management techniques were discussed. C.  A full color page of  Calorie density of various food groups per pound showing examples of each food groups was provided to the patient.   -In light of her persistent elevated alk phos, and hyperlipidemia she is status post liver ultrasound which showed hepatic steatosis.  Her bone scan is not showing evidence of Paget's disease. Before considering referral to gastroenterology,  she is advised to consider coming off of Topamax which can cause elevated alk phos after discussing with her prescriber. She will have repeat CMP and office visit in 3 months.  If she continues to have elevated alk phos, she will be referred to GI.  -Screening for celiac disease is negative.    - I advised her  to maintain close follow up with Elfredia Nevins, MD for primary care needs.   I spent  26  minutes in the care of the patient today including review of labs from Thyroid Function, CMP, and other relevant labs ; imaging/biopsy records (current and previous including abstractions from other facilities); face-to-face time discussing  her lab results and symptoms, medications doses, her options of short and long term treatment based on the latest standards of care / guidelines;   and documenting the encounter.  Paula Oconnor  participated in the discussions, expressed understanding, and voiced agreement with the above plans.  All questions were answered to her satisfaction. she is encouraged to contact clinic should she have any questions or concerns prior to her return visit.    Follow up plan: Return in about 3 months (around 11/28/2023) for F/U with Pre-visit Labs.   Marquis Lunch, MD [facility] [address] [place], [place] [address] Phone: [phone]  Fax: [phone]     08/31/2023, 3:26 PM  This note was partially dictated with voice recognition software. Similar sounding words can be transcribed  inadequately or may not  be corrected upon review.

## 2023-10-12 ENCOUNTER — Encounter: Payer: Self-pay | Admitting: *Deleted

## 2023-11-17 ENCOUNTER — Telehealth: Payer: Self-pay

## 2023-11-17 NOTE — Telephone Encounter (Signed)
 Spoke with pt she stated she is not going to be able to stop taking the Topamax all at once. States she has reduced the dosage since last visit.

## 2023-11-17 NOTE — Telephone Encounter (Signed)
 Pt called stating she has been trying to wean off of Topamax but is not able to completely discontinue it. Pt requested any suggestions.

## 2023-11-30 ENCOUNTER — Ambulatory Visit: Payer: 59 | Admitting: "Endocrinology

## 2024-01-05 LAB — ALKALINE PHOSPHATASE, BONE SPECIFIC

## 2024-01-06 LAB — COMPREHENSIVE METABOLIC PANEL WITH GFR
ALT: 25 IU/L (ref 0–32)
AST: 21 IU/L (ref 0–40)
Albumin: 4.3 g/dL (ref 3.8–4.9)
Alkaline Phosphatase: 270 IU/L — ABNORMAL HIGH (ref 44–121)
BUN/Creatinine Ratio: 25 — ABNORMAL HIGH (ref 9–23)
BUN: 25 mg/dL — ABNORMAL HIGH (ref 6–24)
Bilirubin Total: <0.2 mg/dL (ref 0.0–1.2)
CO2: 19 mmol/L — ABNORMAL LOW (ref 20–29)
Calcium: 9.3 mg/dL (ref 8.7–10.2)
Chloride: 105 mmol/L (ref 96–106)
Creatinine, Ser: 1.01 mg/dL — ABNORMAL HIGH (ref 0.57–1.00)
Globulin, Total: 2.1 g/dL (ref 1.5–4.5)
Glucose: 122 mg/dL — ABNORMAL HIGH (ref 70–99)
Potassium: 4 mmol/L (ref 3.5–5.2)
Sodium: 141 mmol/L (ref 134–144)
Total Protein: 6.4 g/dL (ref 6.0–8.5)
eGFR: 67 mL/min/{1.73_m2} (ref >59)

## 2024-01-06 LAB — ALKALINE PHOSPHATASE, BONE SPECIFIC: Tandem-R Ostase: 36.7 ug/L

## 2024-01-12 ENCOUNTER — Encounter: Payer: Self-pay | Admitting: "Endocrinology

## 2024-01-12 ENCOUNTER — Ambulatory Visit (INDEPENDENT_AMBULATORY_CARE_PROVIDER_SITE_OTHER): Admitting: "Endocrinology

## 2024-01-12 ENCOUNTER — Telehealth: Payer: Self-pay | Admitting: "Endocrinology

## 2024-01-12 ENCOUNTER — Other Ambulatory Visit: Payer: Self-pay | Admitting: "Endocrinology

## 2024-01-12 VITALS — BP 108/64 | HR 108 | Ht 63.0 in | Wt 139.8 lb

## 2024-01-12 DIAGNOSIS — E559 Vitamin D deficiency, unspecified: Secondary | ICD-10-CM

## 2024-01-12 DIAGNOSIS — R7302 Impaired glucose tolerance (oral): Secondary | ICD-10-CM | POA: Diagnosis not present

## 2024-01-12 DIAGNOSIS — E782 Mixed hyperlipidemia: Secondary | ICD-10-CM | POA: Diagnosis not present

## 2024-01-12 DIAGNOSIS — R748 Abnormal levels of other serum enzymes: Secondary | ICD-10-CM | POA: Diagnosis not present

## 2024-01-12 MED ORDER — VITAMIN E 200 UNITS PO CAPS: 400.0000 [IU] | ORAL_CAPSULE | Freq: Every day | ORAL | 1 refills | Status: AC

## 2024-01-12 MED ORDER — VITAMIN D 50 MCG (2000 UT) PO CAPS: 2000.0000 [IU] | ORAL_CAPSULE | Freq: Every day | ORAL | 3 refills | Status: AC

## 2024-01-12 NOTE — Progress Notes (Signed)
 01/12/2024, 9:52 AM                   Endocrinology follow-up note  Subjective:    Patient ID: Paula Oconnor, female    DOB: Mar 05, 1971, PCP Bertell Satterfield, MD   Past Medical History:  Diagnosis Date   Fatty liver    Heel spur, left    Hypercholesteremia    Hyperglycemia    Plantar fasciitis of left foot    Schizoaffective disorder (HCC)    Past Surgical History:  Procedure Laterality Date   CHOLECYSTECTOMY     Social History   Socioeconomic History   Marital status: Married    Spouse name: Not on file   Number of children: 0   Years of education: Not on file   Highest education level: Not on file  Occupational History   Not on file  Tobacco Use   Smoking status: Former    Types: Cigarettes   Smokeless tobacco: Never  Vaping Use   Vaping status: Never Used  Substance and Sexual Activity   Alcohol use: Not Currently   Drug use: Never   Sexual activity: Not Currently    Birth control/protection: Condom, Abstinence  Other Topics Concern   Not on file  Social History Narrative   Not on file   Social Drivers of Health   Financial Resource Strain: Low Risk  (02/26/2021)   Overall Financial Resource Strain (CARDIA)    Difficulty of Paying Living Expenses: Not hard at all  Food Insecurity: No Food Insecurity (04/12/2023)   Hunger Vital Sign    Worried About Running Out of Food in the Last Year: Never true    Ran Out of Food in the Last Year: Never true  Transportation Needs: No Transportation Needs (04/12/2023)   PRAPARE - Administrator, Civil Service (Medical): No    Lack of Transportation (Non-Medical): No  Physical Activity: Insufficiently Active (04/12/2023)   Exercise Vital Sign    Days of Exercise per Week: 3 days    Minutes of Exercise per Session: 20 min  Stress: Stress Concern Present (04/12/2023)   [employer] of Occupational Health - Occupational Stress Questionnaire     Feeling of Stress : Very much  Social Connections: Socially Integrated (04/12/2023)   Social Connection and Isolation Panel    Frequency of Communication with Friends and Family: More than three times a week    Frequency of Social Gatherings with Friends and Family: Three times a week    Attends Religious Services: More than 4 times per year    Active Member of Clubs or Organizations: Yes    Attends Engineer, structural: More than 4 times per year    Marital Status: Married   Family History  Adopted: Yes  Family history unknown: Yes   Outpatient Encounter Medications as of 01/12/2024  Medication Sig   vitamin E 200 UNIT capsule Take 2 capsules (400 Units total) by mouth daily.   Cholecalciferol (VITAMIN D ) 50 MCG (2000 UT) CAPS Take 1 capsule (2,000 Units total) by mouth daily.   citalopram (CELEXA) 20 MG tablet Take 20 mg by mouth 2 (two) times daily.   clonazepam (KLONOPIN) 0.125 MG disintegrating  tablet Take 0.125 mg by mouth 2 (two) times daily.     clotrimazole -betamethasone  (LOTRISONE ) cream Apply to affected area 2 times daily prn (Patient not taking: Reported on 04/12/2023)   cloZAPine (CLOZARIL) 100 MG tablet Take by mouth. Take 1 tab in the morning, 1 in the afternoon, and 4 before bedtime.   dicyclomine (BENTYL) 10 MG capsule Take 10 mg by mouth 4 (four) times daily as needed.   ferrous sulfate 325 (65 FE) MG tablet Take 325 mg by mouth daily with breakfast. Take 2 daily   Multiple Vitamin (MULTIVITAMIN) tablet Take 1 tablet by mouth daily.   topiramate (TOPAMAX) 25 MG tablet Take 25 mg by mouth 3 (three) times daily.   [DISCONTINUED] Cholecalciferol (VITAMIN D ) 50 MCG (2000 UT) CAPS Take 2,000 Units by mouth daily.   No facility-administered encounter medications on file as of 01/12/2024.   ALLERGIES: Allergies  Allergen Reactions   Codeine Other (See Comments)    Patient doesn't remember what happened    VACCINATION STATUS:  There is no immunization history on  file for this patient.  HPI Paula Oconnor is 53 y.o. female who was seen last visit in consultation for hyperglycemia   requested by Bertell Satterfield, MD.  She is returning for follow-up.  She is on vitamin D  replacement.  She did not have any further hypoglycemia or hyperglycemia.  Her recent point-of-care A1c was 5.5%.   She did have her previsit labs showing persistently elevated alkaline phosphatase currently at 270.  She did have a negative bone scan previously-negative for any abnormal tracer activity.  She has low GGT at 13.  She was not able to hold Topamax before her lab draws.    She is known to have hepatic steatosis. She is following more or less whole food plant-based diet.    She is not on any anti- diabetes medication, however has taken Topamax for years.  -Her previous labs ruled out adrenal insufficiency, thyroid  dysfunction.  She returns with 4 pounds of weight loss since last visit.       She is on a list of medications related to her mood disorder including clozapine, clonazepam, Klonopin, risperidone.  She has history of anemia in the past however her most recent CBC showed hemoglobin of 13.6.  She is adopted, does not know any family history of diabetes.  She denies alcohol abuse, she denies any prior exposure to steroids.   Review of Systems Limited as above.    Objective:    BP 108/64   Pulse (!) 108   Ht 5' 3 (1.6 m)   Wt 139 lb 12.8 oz (63.4 kg)   BMI 24.76 kg/m   Wt Readings from Last 3 Encounters:  01/12/24 139 lb 12.8 oz (63.4 kg)  08/31/23 143 lb (64.9 kg)  07/27/23 142 lb 9.6 oz (64.7 kg)    Physical Exam  Recent Results (from the past 2160 hours)  Comprehensive metabolic panel     Status: Abnormal   Collection Time: 01/04/24  4:08 PM  Result Value Ref Range   Glucose 122 (H) 70 - 99 mg/dL   BUN 25 (H) 6 - 24 mg/dL   Creatinine, Ser 8.98 (H) 0.57 - 1.00 mg/dL   eGFR 67 >40 fO/fpw/8.26   BUN/Creatinine Ratio 25 (H) 9 - 23   Sodium 141  134 - 144 mmol/L   Potassium 4.0 3.5 - 5.2 mmol/L   Chloride 105 96 - 106 mmol/L   CO2 19 (L) 20 - 29  mmol/L   Calcium 9.3 8.7 - 10.2 mg/dL   Total Protein 6.4 6.0 - 8.5 g/dL   Albumin 4.3 3.8 - 4.9 g/dL   Globulin, Total 2.1 1.5 - 4.5 g/dL   Bilirubin Total <9.7 0.0 - 1.2 mg/dL   Alkaline Phosphatase 270 (H) 44 - 121 IU/L   AST 21 0 - 40 IU/L   ALT 25 0 - 32 IU/L  Alkaline phosphatase, bone specific     Status: None   Collection Time: 01/04/24  4:08 PM  Result Value Ref Range   Tandem-R Ostase 36.7 ug/L    Comment:          Premenopausal Women:               6.0 - 22.7          Postmenopausal Women:              8.1 - 31.6       Assessment & Plan:   1.  Vitamin D  deficiency--replete  2.  Dyslipidemia, elevated alkaline phosphatase 3.  Vitamin D  deficiency 4.  Impaired glucose tolerance  -She was previously worked up for thyroid  and adrenal function with normal findings.   -Her recent CMP showed glucose of 122, recent point-of-care A1c of 5.5%.  She previously had  random hyperglycemia .She will not need intervention for glycemic excursion at this time.    -She is  vitamin D  replete at 36, advised to continue vitamin D3 2000 units daily.   In light of her nonalcoholic fatty liver disease, she starts to benefit from whole food plant-based diet. - she acknowledges that there is a room for improvement in her food and drink choices. - Suggestion is made for her to avoid simple carbohydrates  from her diet including Cakes, Sweet Desserts, Ice Cream, Soda (diet and regular), Sweet Tea, Candies, Chips, Cookies, Store Bought Juices, Alcohol , Artificial Sweeteners,  Coffee Creamer, and Sugar-free Products, Lemonade. This will help patient to have more stable blood glucose profile and potentially avoid unintended weight gain.     -In light of her persistent elevated alk phos, and hyperlipidemia she is status post liver ultrasound which showed hepatic steatosis.  Her bone scan is  not showing evidence of Paget's disease.  Out of her medications, Topamax can cause elevated alk phos, however she could not hold it before her lab draw. Her GGT was 13, bone specific alkaline phosphatase slightly elevated at 36. For completion purposes, I have ordered bone density, serum protein electrophoresis, fib 4, serum magnesium and phosphorus, and repeat 25-hydroxy vitamin D  before her next visit in 3 months. I have also discussed and ordered vitamin D  200 units daily, along with her vitamin D .  - She may benefit from a GI evaluation, will be considered after her next visit.   -Screening for celiac disease is negative.    - I advised her  to maintain close follow up with Bertell Satterfield, MD for primary care needs.   I spent  26  minutes in the care of the patient today including review of labs from Thyroid  Function, CMP, and other relevant labs ; imaging/biopsy records (current and previous including abstractions from other facilities); face-to-face time discussing  her lab results and symptoms, medications doses, her options of short and long term treatment based on the latest standards of care / guidelines;   and documenting the encounter.  Paula Oconnor  participated in the discussions, expressed understanding, and voiced agreement with the above  plans.  All questions were answered to her satisfaction. she is encouraged to contact clinic should she have any questions or concerns prior to her return visit.    Follow up plan: Return in about 3 months (around 04/13/2024) for Fasting Labs  in AM B4 8, DXA Scan B4 NV.   Ranny Earl, MD [facility] [address] [place], [place] [address] Phone: [phone]  Fax: [phone]     01/12/2024, 9:52 AM  This note was partially dictated with voice recognition software. Similar sounding words can be transcribed inadequately or may not  be corrected upon review.

## 2024-01-12 NOTE — Telephone Encounter (Signed)
 Patient is asking if you can call in iron pills for her? Please Advise

## 2024-01-12 NOTE — Telephone Encounter (Signed)
 Dr. Lenis ordered a Dexa for her

## 2024-01-12 NOTE — Telephone Encounter (Signed)
 Will fax order to [facility].

## 2024-01-13 NOTE — Telephone Encounter (Signed)
 Pt made aware

## 2024-01-13 NOTE — Telephone Encounter (Signed)
 Spoke with pt, she stated she is taking OTC iron already and wanted to see if her insurance would cover a Rx.

## 2024-02-02 ENCOUNTER — Ambulatory Visit (HOSPITAL_COMMUNITY)
Admission: RE | Admit: 2024-02-02 | Discharge: 2024-02-02 | Disposition: A | Discharge status: Home / Self Care | Source: Ambulatory Visit | Attending: "Endocrinology | Admitting: "Endocrinology

## 2024-02-02 DIAGNOSIS — R748 Abnormal levels of other serum enzymes: Secondary | ICD-10-CM | POA: Insufficient documentation

## 2024-02-02 DIAGNOSIS — Z780 Asymptomatic menopausal state: Secondary | ICD-10-CM | POA: Diagnosis not present

## 2024-02-02 DIAGNOSIS — M810 Age-related osteoporosis without current pathological fracture: Secondary | ICD-10-CM | POA: Diagnosis not present

## 2024-02-02 DIAGNOSIS — Z13820 Encounter for screening for osteoporosis: Secondary | ICD-10-CM | POA: Diagnosis not present

## 2024-02-29 ENCOUNTER — Telehealth: Payer: Self-pay

## 2024-02-29 NOTE — Telephone Encounter (Signed)
 Spoke with pt advising that her DEXA scan showed she does have osteoporosis per Dr.Nida and he would like to discuss treatment options in person at her next visit. Pt voiced understanding.

## 2024-02-29 NOTE — Telephone Encounter (Signed)
 Pt called requesting results of Bone Density Study.

## 2024-02-29 NOTE — Telephone Encounter (Signed)
 Left a message requesting pt return call to the office.

## 2024-04-24 LAB — PROTEIN ELECTROPHORESIS, SERUM
A/G Ratio: 1.6 (ref 0.7–1.7)
Albumin ELP: 3.9 g/dL (ref 2.9–4.4)
Alpha 1: 0.2 g/dL (ref 0.0–0.4)
Alpha 2: 0.7 g/dL (ref 0.4–1.0)
Beta: 0.9 g/dL (ref 0.7–1.3)
Gamma Globulin: 0.7 g/dL (ref 0.4–1.8)
Globulin, Total: 2.5 g/dL (ref 2.2–3.9)

## 2024-04-24 LAB — COMPREHENSIVE METABOLIC PANEL WITH GFR
ALT: 29 IU/L (ref 0–32)
AST: 27 IU/L (ref 0–40)
Albumin: 4.4 g/dL (ref 3.8–4.9)
Alkaline Phosphatase: 203 IU/L — ABNORMAL HIGH (ref 49–135)
BUN/Creatinine Ratio: 23 (ref 9–23)
BUN: 23 mg/dL (ref 6–24)
Bilirubin Total: 0.3 mg/dL (ref 0.0–1.2)
CO2: 24 mmol/L (ref 20–29)
Calcium: 9.6 mg/dL (ref 8.7–10.2)
Chloride: 107 mmol/L — ABNORMAL HIGH (ref 96–106)
Creatinine, Ser: 1.01 mg/dL — ABNORMAL HIGH (ref 0.57–1.00)
Globulin, Total: 2 g/dL (ref 1.5–4.5)
Glucose: 108 mg/dL — ABNORMAL HIGH (ref 70–99)
Potassium: 4 mmol/L (ref 3.5–5.2)
Sodium: 145 mmol/L — ABNORMAL HIGH (ref 134–144)
Total Protein: 6.4 g/dL (ref 6.0–8.5)
eGFR: 67 mL/min/1.73 (ref >59)

## 2024-04-24 LAB — CBC WITH DIFFERENTIAL/PLATELET
Basophils Absolute: 0 x10E3/uL (ref 0.0–0.2)
Basos: 1 %
EOS (ABSOLUTE): 0 x10E3/uL (ref 0.0–0.4)
Eos: 0 %
Hematocrit: 43.9 % (ref 34.0–46.6)
Hemoglobin: 14.2 g/dL (ref 11.1–15.9)
Immature Grans (Abs): 0 x10E3/uL (ref 0.0–0.1)
Immature Granulocytes: 0 %
Lymphocytes Absolute: 2 x10E3/uL (ref 0.7–3.1)
Lymphs: 30 %
MCH: 32.2 pg (ref 26.6–33.0)
MCHC: 32.3 g/dL (ref 31.5–35.7)
MCV: 100 fL — ABNORMAL HIGH (ref 79–97)
Monocytes Absolute: 0.6 x10E3/uL (ref 0.1–0.9)
Monocytes: 10 %
Neutrophils Absolute: 3.8 x10E3/uL (ref 1.4–7.0)
Neutrophils: 59 %
Platelets: 145 x10E3/uL — ABNORMAL LOW (ref 150–450)
RBC: 4.41 x10E6/uL (ref 3.77–5.28)
RDW: 12.1 % (ref 11.7–15.4)
WBC: 6.4 x10E3/uL (ref 3.4–10.8)

## 2024-04-24 LAB — LIPID PANEL
Chol/HDL Ratio: 2.5 ratio (ref 0.0–4.4)
Cholesterol, Total: 185 mg/dL (ref 100–199)
HDL: 74 mg/dL (ref >39)
LDL Chol Calc (NIH): 98 mg/dL (ref 0–99)
Triglycerides: 67 mg/dL (ref 0–149)
VLDL Cholesterol Cal: 13 mg/dL (ref 5–40)

## 2024-04-24 LAB — FIB-4 W/REFLEX TO ELF: FIB-4 Index: 1.83 (ref 0.00–2.67)

## 2024-04-24 LAB — ENHANCED LIVER FIBROSIS (ELF): ELF(TM) Score: 8.6 (ref <9.80)

## 2024-04-24 LAB — PHOSPHORUS: Phosphorus: 2.9 mg/dL — ABNORMAL LOW (ref 3.0–4.3)

## 2024-04-24 LAB — MAGNESIUM: Magnesium: 2.3 mg/dL (ref 1.6–2.3)

## 2024-04-24 LAB — VITAMIN D 25 HYDROXY (VIT D DEFICIENCY, FRACTURES): Vit D, 25-Hydroxy: 52.5 ng/mL (ref 30.0–100.0)

## 2024-05-03 ENCOUNTER — Ambulatory Visit: Admitting: "Endocrinology

## 2024-05-04 ENCOUNTER — Ambulatory Visit: Admitting: "Endocrinology

## 2024-05-24 ENCOUNTER — Encounter: Payer: Self-pay | Admitting: Emergency Medicine

## 2024-05-24 ENCOUNTER — Ambulatory Visit
Admission: EM | Admit: 2024-05-24 | Discharge: 2024-05-24 | Disposition: A | Discharge status: Home / Self Care | Attending: Nurse Practitioner | Admitting: Nurse Practitioner

## 2024-05-24 ENCOUNTER — Other Ambulatory Visit: Payer: Self-pay

## 2024-05-24 DIAGNOSIS — G8929 Other chronic pain: Secondary | ICD-10-CM | POA: Diagnosis not present

## 2024-05-24 DIAGNOSIS — D649 Anemia, unspecified: Secondary | ICD-10-CM | POA: Diagnosis not present

## 2024-05-24 DIAGNOSIS — R109 Unspecified abdominal pain: Secondary | ICD-10-CM

## 2024-05-24 HISTORY — DX: Age-related osteoporosis without current pathological fracture: M81.0

## 2024-05-24 MED ORDER — DICYCLOMINE HCL 10 MG PO CAPS: 10.0000 mg | ORAL_CAPSULE | Freq: Three times a day (TID) | ORAL | 0 refills | Status: AC

## 2024-05-24 NOTE — Discharge Instructions (Addendum)
 Take medication as prescribed. A referral has been placed for you to gastroenterology.  You will need to call to schedule an appointment. Increase fluids and allow for plenty of rest. You may take over-the-counter Tylenol as needed for pain, fever, or general discomfort. Follow-up with your primary care physician as scheduled. Follow-up as needed.

## 2024-05-24 NOTE — ED Provider Notes (Signed)
 [facility]    CSN: [id number] Arrival date & time: 05/24/24  1023      History   Chief Complaint No chief complaint on file.   HPI Paula Oconnor is a 53 y.o. female.   The history is provided by the patient.   Patient presents for complaints of anemia, bloating, abdominal pain, fatigue, and concern for an intestinal parasite.  Patient reports symptoms have been present for the past several years.  Patient also states that she has noticed that she remains hungry even after she eats.  Patient with underlying history of anemia.  She is being seen by Dr. Neda for treatment of fatty liver and hyperglycemia.  Patient states that she was prescribed dicyclomine by Dr. Bertell, but is concerned that the medication is expired.  Patient denies fever, chills, nausea, vomiting, diarrhea, bloody stools, or urinary symptoms.  Patient states that she has found all of her symptoms online and they are consistent with a intestinal parasite.  Patient states, have seen several doctors for this but no one can find anything.  Past Medical History:  Diagnosis Date   Fatty liver    Heel spur, left    Hypercholesteremia    Hyperglycemia    Osteoporosis    Plantar fasciitis of left foot    Schizoaffective disorder [facility])     Patient Active Problem List   Diagnosis Date Noted   Missed periods 04/12/2023   Hot flashes 04/12/2023   Perimenopause 04/12/2023   Encounter for well woman exam with routine gynecological exam 04/12/2023   Screening for colorectal cancer 04/12/2023   Fatty liver 04/12/2023   Menopause 04/12/2023   Elevated alkaline phosphatase level 07/16/2022   Encounter for screening fecal occult blood testing 02/26/2021   Encounter for gynecological examination with Papanicolaou smear of cervix 02/26/2021   Mixed hyperlipidemia 11/30/2019   Vitamin D  deficiency 11/30/2018   Hyperglycemia 11/30/2018   Impaired glucose tolerance 11/21/2018   Weight gain 11/21/2018     Past Surgical History:  Procedure Laterality Date   CHOLECYSTECTOMY      OB History     Gravida  1   Para      Term      Preterm      AB  1   Living         SAB      IAB      Ectopic      Multiple      Live Births               Home Medications    Prior to Admission medications   Medication Sig Start Date End Date Taking? Authorizing Provider  dicyclomine (BENTYL) 10 MG capsule Take 1 capsule (10 mg total) by mouth 4 (four) times daily -  before meals and at bedtime. 05/24/24  Yes Leath-Warren, Etta PARAS, NP  Cholecalciferol (VITAMIN D ) 50 MCG (2000 UT) CAPS Take 1 capsule (2,000 Units total) by mouth daily. 01/12/24   Nida, Gebreselassie W, MD  citalopram (CELEXA) 20 MG tablet Take 20 mg by mouth 2 (two) times daily. 11/22/19   [provider]  clonazepam (KLONOPIN) 0.125 MG disintegrating tablet Take 0.125 mg by mouth 2 (two) times daily.      [provider]  clotrimazole -betamethasone  (LOTRISONE ) cream Apply to affected area 2 times daily prn Patient not taking: Reported on 04/12/2023 12/06/22   Leath-Warren, Etta PARAS, NP  cloZAPine (CLOZARIL) 100 MG tablet Take by mouth. Take 1 tab in  the morning, 1 in the afternoon, and 4 before bedtime.    [provider]  ferrous sulfate 325 (65 FE) MG tablet Take 325 mg by mouth daily with breakfast. Take 2 daily    [provider]  Multiple Vitamin (MULTIVITAMIN) tablet Take 1 tablet by mouth daily.    [provider]  topiramate (TOPAMAX) 25 MG tablet Take 25 mg by mouth 3 (three) times daily. 05/03/20   [provider]  vitamin E  200 UNIT capsule Take 2 capsules (400 Units total) by mouth daily. 01/12/24   Lenis Ethelle ORN, MD    Family History Family History  Adopted: Yes  Family history unknown: Yes    Social History Social History   Tobacco Use   Smoking status: Former    Types: Cigarettes   Smokeless tobacco: Never  Vaping Use   Vaping  status: Never Used  Substance Use Topics   Alcohol use: Not Currently   Drug use: Never     Allergies   Codeine   Review of Systems Review of Systems Per HPI  Physical Exam Triage Vital Signs ED Triage Vitals  Encounter Vitals Group     BP 05/24/24 1100 120/79     Girls Systolic BP Percentile --      Girls Diastolic BP Percentile --      Boys Systolic BP Percentile --      Boys Diastolic BP Percentile --      Pulse Rate 05/24/24 1100 (!) 110     Resp 05/24/24 1100 20     Temp 05/24/24 1100 (!) 97.5 F (36.4 C)     Temp Source 05/24/24 1100 Oral     SpO2 05/24/24 1100 95 %     Weight --      Height --      Head Circumference --      Peak Flow --      Pain Score 05/24/24 1059 0     Pain Loc --      Pain Education --      Exclude from Growth Chart --    No data found.  Updated Vital Signs BP 120/79 (BP Location: Right Arm)   Pulse (!) 110   Temp (!) 97.5 F (36.4 C) (Oral)   Resp 20   SpO2 95%   Visual Acuity Right Eye Distance:   Left Eye Distance:   Bilateral Distance:    Right Eye Near:   Left Eye Near:    Bilateral Near:     Physical Exam Vitals and nursing note reviewed.  Constitutional:      General: She is not in acute distress.    Appearance: Normal appearance.  HENT:     Head: Normocephalic.  Eyes:     Extraocular Movements: Extraocular movements intact.     Pupils: Pupils are equal, round, and reactive to light.  Cardiovascular:     Rate and Rhythm: Regular rhythm. Tachycardia present.     Pulses: Normal pulses.     Heart sounds: Normal heart sounds.  Pulmonary:     Effort: Pulmonary effort is normal. No respiratory distress.     Breath sounds: Normal breath sounds. No stridor. No wheezing, rhonchi or rales.  Abdominal:     General: Bowel sounds are normal. There is no distension.     Palpations: Abdomen is soft.     Tenderness: There is no abdominal tenderness. There is no right CVA tenderness, left CVA tenderness, guarding or  rebound.  Musculoskeletal:  Cervical back: Normal range of motion.  Skin:    General: Skin is warm and dry.  Neurological:     General: No focal deficit present.     Mental Status: She is alert and oriented to person, place, and time.  Psychiatric:        Mood and Affect: Mood normal.        Behavior: Behavior normal.      UC Treatments / Results  Labs (all labs ordered are listed, but only abnormal results are displayed) Labs Reviewed - No data to display  EKG   Radiology No results found.  Procedures Procedures (including critical care time)  Medications Ordered in UC Medications - No data to display  Initial Impression / Assessment and Plan / UC Course  I have reviewed the triage vital signs and the nursing notes.  Pertinent labs & imaging results that were available during my care of the patient were reviewed by me and considered in my medical decision making (see chart for details).  On exam, the patient is well-appearing, she is in no acute distress, vital signs are stable, although she is mildly tachycardic.  She does not exhibit any abdominal tenderness or guarding.  She reports that she has not had any episodes of diarrhea, nausea, or vomiting.  She does endorse intermittent abdominal pain.  Discussion with patient regarding her current symptoms.  Patient was advised that she may need to follow-up with GI for further evaluation and reassurance.  Dicyclomine 10 mg was prescribed for abdominal pain, referral was placed for the patient to follow-up with GI.  Discussed indications for patient to follow-up with her PCP.  Patient was in agreement with this plan of care and verbalized understanding.  All questions were answered.  Patient stable for discharge.  Final Clinical Impressions(s) / UC Diagnoses   Final diagnoses:  Abdominal pain, unspecified abdominal location  Anemia, unspecified type     Discharge Instructions      Take medication as prescribed. A  referral has been placed for you to gastroenterology.  You will need to call to schedule an appointment. Increase fluids and allow for plenty of rest. You may take over-the-counter Tylenol as needed for pain, fever, or general discomfort. Follow-up with your primary care physician as scheduled. Follow-up as needed.   ED Prescriptions     Medication Sig Dispense Auth. Provider   dicyclomine (BENTYL) 10 MG capsule Take 1 capsule (10 mg total) by mouth 4 (four) times daily -  before meals and at bedtime. 30 capsule Leath-Warren, Etta PARAS, NP      PDMP not reviewed this encounter.   Gilmer Etta PARAS, NP 05/24/24 1827

## 2024-05-24 NOTE — ED Triage Notes (Signed)
 Pt is concerned for possible intestinal parasite for last several years. Pt reports I have all the symptoms that I found online. Pt reports I'm bloated and I'm hungry even after eating a big meal.

## 2024-05-25 ENCOUNTER — Encounter: Payer: Self-pay | Admitting: Gastroenterology

## 2024-05-25 ENCOUNTER — Ambulatory Visit (INDEPENDENT_AMBULATORY_CARE_PROVIDER_SITE_OTHER): Admitting: Gastroenterology

## 2024-05-25 VITALS — BP 110/72 | HR 114 | Temp 97.7°F | Ht 63.0 in | Wt 141.4 lb

## 2024-05-25 DIAGNOSIS — D649 Anemia, unspecified: Secondary | ICD-10-CM | POA: Diagnosis not present

## 2024-05-25 DIAGNOSIS — R632 Polyphagia: Secondary | ICD-10-CM | POA: Diagnosis not present

## 2024-05-25 DIAGNOSIS — K5900 Constipation, unspecified: Secondary | ICD-10-CM

## 2024-05-25 DIAGNOSIS — R140 Abdominal distension (gaseous): Secondary | ICD-10-CM

## 2024-05-25 DIAGNOSIS — R5383 Other fatigue: Secondary | ICD-10-CM

## 2024-05-25 DIAGNOSIS — Z1211 Encounter for screening for malignant neoplasm of colon: Secondary | ICD-10-CM

## 2024-05-25 DIAGNOSIS — G4700 Insomnia, unspecified: Secondary | ICD-10-CM

## 2024-05-25 NOTE — Patient Instructions (Addendum)
 I have ordered the stool studies for you.  As we discussed you will need to provide 3 different samples.  I also ordered iron panel and celiac testing for you given your anemia, fatigue, and bloating.  Will get you scheduled for a colonoscopy for colon cancer screening.  You may continue the dicyclomine as needed for abdominal pain currently but hold if no bowel movement in 24 hours.  You also need to hold this for a few days prior to your colonoscopy to ensure good prep.  Continue to avoid all NSAIDs (ibuprofen, Aleve, Advil, Motrin, meloxicam, etc.)  For constipation I recommend following a higher fiber diet.   We may consider upper endoscopy in the near future if colonoscopy is negative and other labs are also negative including the stool samples.  Follow-up in 3 months, this will be after your colonoscopy is performed.  We can consider further testing for SIBO/SIMO in the future as well.  It was a pleasure to see you today. I want to create trusting relationships with patients. If you receive a survey regarding your visit,  I greatly appreciate you taking time to fill this out on paper or through your MyChart. I value your feedback.  Charmaine Melia, MSN, FNP-BC, AGACNP-BC [facility]

## 2024-05-25 NOTE — Progress Notes (Signed)
 GI Office Note    Referring Provider: Gilmer Rogue * Primary Care Physician:  Patient, No Pcp Per  Primary Gastroenterologist: Lamar HERO.Rourk, MD  Chief Complaint   Chief Complaint  Patient presents with   parasites     Thinks she has a parasite. She Google her symptoms    History of Present Illness   Paula Oconnor is a 53 y.o. female presenting today at the request of Leath-Warren, Rogue * for concerns about parasites in her intestine.  Per review of visit with urgent care yesterday she presented with complaints of anemia, bloating, abdominal pain, fatigue, and concern for intestinal parasite.  She states symptoms have been present for several years and notes that she remains hungry even after eating.  Notes history of underlying anemia by Dr. Lenis for treatment of fatty liver and hyperglycemia.  Notes she was previously given dicyclomine by Dr. Bertell as well as concern that the medi.hcation is expired.  She states she googled her symptoms and stated they were consistent with an intestinal parasite.  She was noted to not be in any acute distress but was mildly tachycardic.  Abdominal exam benign.  She was advised that she may need to follow-up with GI for further evaluation and reassurance.  Dicyclomine was prescribed for her abdominal pain for her.      Latest Ref Rng & Units 04/18/2024    8:15 AM 10/11/2018   12:00 AM  CBC  WBC 3.4 - 10.8 x10E3/uL 6.4  10.5      Hemoglobin 11.1 - 15.9 g/dL 85.7  86.7      Hematocrit 34.0 - 46.6 % 43.9    Platelets 150 - 450 x10E3/uL 145       This result is from an external source.   Labs October 2025: Elf 8.6, glucose 108, creatinine 1.01, sodium 145, alk phos 203, normal AST and ALT.  Fib-4 1.83, normal triglycerides and cholesterol.  Normal vitamin D .  Today:  Discussed the use of AI scribe software for clinical note transcription with the patient, who gave verbal consent to proceed.  She experiences chronic fatigue,  gas, bloating, insomnia, constipation, and a lack of satisfaction after meals. She feels hungry shortly after eating, which is exhausting and stressful. Additionally, she experiences teeth grinding at night and muscular soreness. Given her symptoms she is concerned about possible intestinal parasite as she googled her symptoms, and she reports correlation to the possible symptoms related to parasitic infections.   She has a history of anemia for several years and takes two iron pills daily. Her iron levels have not been checked recently. She has lost about ten pounds over the past two to three years. Despite eating frequently, she has not gained weight.  Her bowel movements occur every couple of days, with severe cramps sometimes during bowel movements. If she hasn't had a bowel movement in a while, her stool is black, but it is not black with more regular bowel movements. No bright red blood in her stool. She denies taking Pepto Bismol, kaopectate, ibuprofen, Aleve, Advil, or BC Goody powders currently, although she used to take ibuprofen, which affected her kidney function.  No chest pain, shortness of breath, regular nausea, or vomiting, although she sometimes experiences nausea and vomiting when trying to have a bowel movement. She denies reflux symptoms but experiences belching every few days.  She has not had an upper endoscopy or colonoscopy before. She is unaware of her family history due to being adopted. She has  been told she has fatty liver and had blood work done in October. She has not had a recent iron panel.      Wt Readings from Last 6 Encounters:  05/25/24 141 lb 6.4 oz (64.1 kg)  01/12/24 139 lb 12.8 oz (63.4 kg)  08/31/23 143 lb (64.9 kg)  07/27/23 142 lb 9.6 oz (64.7 kg)  04/12/23 151 lb 8 oz (68.7 kg)  03/23/23 152 lb 12.8 oz (69.3 kg)    Body mass index is 25.05 kg/m.  Current Outpatient Medications  Medication Sig Dispense Refill   Cholecalciferol (VITAMIN D ) 50 MCG  (2000 UT) CAPS Take 1 capsule (2,000 Units total) by mouth daily. 90 capsule 3   citalopram (CELEXA) 20 MG tablet Take 20 mg by mouth 2 (two) times daily.     clonazepam (KLONOPIN) 0.125 MG disintegrating tablet Take 0.125 mg by mouth 2 (two) times daily.       cloZAPine (CLOZARIL) 100 MG tablet Take by mouth. Take 1 tab in the morning, 1 in the afternoon, and 4 before bedtime.     dicyclomine (BENTYL) 10 MG capsule Take 1 capsule (10 mg total) by mouth 4 (four) times daily -  before meals and at bedtime. 30 capsule 0   ferrous sulfate 325 (65 FE) MG tablet Take 325 mg by mouth daily with breakfast. Take 2 daily     Multiple Vitamin (MULTIVITAMIN) tablet Take 1 tablet by mouth daily.     topiramate (TOPAMAX) 25 MG tablet Take 25 mg by mouth 3 (three) times daily.     vitamin E  200 UNIT capsule Take 2 capsules (400 Units total) by mouth daily. 90 capsule 1   clotrimazole -betamethasone  (LOTRISONE ) cream Apply to affected area 2 times daily prn (Patient not taking: Reported on 05/25/2024) 45 g 0   No current facility-administered medications for this visit.    Past Medical History:  Diagnosis Date   Fatty liver    Heel spur, left    Hypercholesteremia    Hyperglycemia    Osteoporosis    Plantar fasciitis of left foot    Schizoaffective disorder (HCC)     Past Surgical History:  Procedure Laterality Date   CHOLECYSTECTOMY      Family History  Adopted: Yes  Family history unknown: Yes    Allergies as of 05/25/2024 - Review Complete 05/24/2024  Allergen Reaction Noted   Codeine Other (See Comments) 02/08/2011    Social History   Socioeconomic History   Marital status: Married    Spouse name: Not on file   Number of children: 0   Years of education: Not on file   Highest education level: Not on file  Occupational History   Not on file  Tobacco Use   Smoking status: Former    Types: Cigarettes   Smokeless tobacco: Never  Vaping Use   Vaping status: Never Used   Substance and Sexual Activity   Alcohol use: Not Currently   Drug use: Never   Sexual activity: Not Currently    Birth control/protection: Condom, Abstinence  Other Topics Concern   Not on file  Social History Narrative   Not on file   Social Drivers of Health   Financial Resource Strain: Low Risk  (02/26/2021)   Overall Financial Resource Strain (CARDIA)    Difficulty of Paying Living Expenses: Not hard at all  Food Insecurity: No Food Insecurity (04/12/2023)   Hunger Vital Sign    Worried About Running Out of Food in the Last Year: Never  true    Ran Out of Food in the Last Year: Never true  Transportation Needs: No Transportation Needs (04/12/2023)   PRAPARE - Administrator, Civil Service (Medical): No    Lack of Transportation (Non-Medical): No  Physical Activity: Insufficiently Active (04/12/2023)   Exercise Vital Sign    Days of Exercise per Week: 3 days    Minutes of Exercise per Session: 20 min  Stress: Stress Concern Present (04/12/2023)   [employer] of Occupational Health - Occupational Stress Questionnaire    Feeling of Stress : Very much  Social Connections: Socially Integrated (04/12/2023)   Social Connection and Isolation Panel    Frequency of Communication with Friends and Family: More than three times a week    Frequency of Social Gatherings with Friends and Family: Three times a week    Attends Religious Services: More than 4 times per year    Active Member of Clubs or Organizations: Yes    Attends Banker Meetings: More than 4 times per year    Marital Status: Married  Catering Manager Violence: Not At Risk (04/12/2023)   Humiliation, Afraid, Rape, and Kick questionnaire    Fear of Current or Ex-Partner: No    Emotionally Abused: No    Physically Abused: No    Sexually Abused: No    Review of Systems   Gen: + fatigue. Denies any fever, chills, weight loss, lack of appetite.  CV: Denies chest pain, heart palpitations,  peripheral edema, syncope.  Resp: Denies shortness of breath at rest or with exertion. Denies wheezing or cough.  GI: see HPI GU : Denies urinary burning, urinary frequency, urinary hesitancy Derm: Denies rash, itching, dry skin + muscle soreness Psych: + anxiety and insomnia. Denies depression, memory loss, and confusion Heme: Denies bruising, bleeding, and enlarged lymph nodes.  Physical Exam   BP 110/72 (BP Location: Right Arm, Patient Position: Sitting)   Pulse (!) 114   Temp 97.7 F (36.5 C) (Temporal)   Ht 5' 3 (1.6 m)   Wt 141 lb 6.4 oz (64.1 kg)   BMI 25.05 kg/m   General:   Alert and oriented. Pleasant and cooperative. Well-nourished and well-developed.  Head:  Normocephalic and atraumatic. Eyes:  Without icterus, sclera clear and conjunctiva pink.  Ears:  Normal auditory acuity. Mouth:  No deformity or lesions, oral mucosa pink.  Lungs:  Clear to auscultation bilaterally. No wheezes, rales, or rhonchi. No distress.  Heart:  S1, S2 present without murmurs appreciated.  Abdomen:  +BS, soft, non-tender and non-distended. No HSM noted. No guarding or rebound. No masses appreciated.  Rectal:  deferred Msk:  Symmetrical without gross deformities. Normal posture. Extremities:  Without edema. Neurologic:  Alert and  oriented x4;  grossly normal neurologically. Skin:  Intact without significant lesions or rashes. Psych:  Alert and cooperative. Normal mood and affect.  Assessment & Plan   Paula Oconnor is a 53 y.o. female with a history of anemia maintained on iron twice daily, osteoporosis, schizoaffective disorder, HLD, hyperglycemia, and hepatic steatosis presenting today with multiple concerns including bloating, polyphagia, constipation, insomnia, fatigue, nausea, and abdominal pain with bowel movements.    Chronic iron deficiency anemia Contributing to fatigue possibly. Iron absorption needs assessment, no recent check. Most recent Hgb within normal range 5 years  ago and recently.  No prior EGD or colonoscopy. - Ordered iron panel to assess iron levels and absorption - Rule out celiac  - Consider EGD if colon negative or  evidence of iron deficiency despite replacement therapy.  - Avoid NSAIDs.   Chronic constipation Infrequent bowel movements every couple of days. No straining reported. Occasional black stools when bowel movements are infrequent. No recent use of NSAIDs or bismuth subsalicylate. Nausea and vomiting associated with straining and cramping during bowel movements, likely vagal response or IBS. - Ordered colonoscopy to evaluate for potential causes of constipation and black stools - Advised high fiber diet.   Abdominal bloating and pain with chronic fatigue Abdominal bloating, pain, and chronic fatigue. Differential includes reflux, overactive metabolism, and celiac disease. No recent celiac testing. Symptoms may be related to iron deficiency anemia, celiac, IBS, SIBO, or SIMO.  - Ordered celiac disease antibody test - Ordered blood work to assess iron levels and absorption - Rule out O & P, les likely given no diarrhea.  - Can assess for SIBO/SIMO if other labs negative and colonoscopy and stools unremarkable.   Evaluation for suspected intestinal parasitic infection Suspected intestinal parasitic infection due to symptoms of lack of satisfaction after meals and chronic fatigue. No recent travel or exposure to contaminated water. Dog present in home. No recent colon cancer screening. - Ordered ova and parasite stool test with three samples to evaluate for intestinal parasites - Ordered colonoscopy to evaluate for potential causes of symptoms  Screening for colon cancer Due to age and lack of previous screening. No known family history of colon cancer or polyps due to adoption. No rectal bleeding or weight loss reported. Does have bloating and fatigue and history of anemia.  - Order colonoscopy for colon cancer screening      Proceed  with colonoscopy with propofol by Dr. Shaaron in near future: the risks, benefits, and alternatives have been discussed with the patient in detail. The patient states understanding and desires to proceed. ASA 2  No dicyclomine for 5 days prior.  Hold iron for 1 week prior   Follow up   Follow up 3 months.   Charmaine Melia, MSN, FNP-BC, AGACNP-BC [facility]

## 2024-05-28 ENCOUNTER — Ambulatory Visit: Payer: Self-pay | Admitting: Gastroenterology

## 2024-05-28 LAB — CELIAC DISEASE PANEL
Endomysial IgA: NEGATIVE
Immunoglobulin A, (IgA) QN, Serum: 153 mg/dL (ref 87–352)
t-Transglutaminase (tTG) IgA: <2 U/mL (ref 0–3)

## 2024-05-28 LAB — IRON,TIBC AND FERRITIN PANEL
Ferritin: 373 ng/mL — ABNORMAL HIGH (ref 15–150)
Iron Saturation: 34 % (ref 15–55)
Iron: 96 ug/dL (ref 27–159)
Total Iron Binding Capacity: 280 ug/dL (ref 250–450)
UIBC: 184 ug/dL (ref 131–425)

## 2024-05-31 ENCOUNTER — Encounter: Payer: Self-pay | Admitting: "Endocrinology

## 2024-05-31 ENCOUNTER — Ambulatory Visit (INDEPENDENT_AMBULATORY_CARE_PROVIDER_SITE_OTHER): Admitting: "Endocrinology

## 2024-05-31 VITALS — BP 114/56 | HR 96 | Ht 63.0 in | Wt 140.6 lb

## 2024-05-31 DIAGNOSIS — E559 Vitamin D deficiency, unspecified: Secondary | ICD-10-CM

## 2024-05-31 DIAGNOSIS — E782 Mixed hyperlipidemia: Secondary | ICD-10-CM

## 2024-05-31 DIAGNOSIS — M810 Age-related osteoporosis without current pathological fracture: Secondary | ICD-10-CM

## 2024-05-31 DIAGNOSIS — R748 Abnormal levels of other serum enzymes: Secondary | ICD-10-CM

## 2024-05-31 LAB — OVA AND PARASITE EXAMINATION

## 2024-05-31 MED ORDER — ALENDRONATE SODIUM 70 MG PO TABS: 70.0000 mg | ORAL_TABLET | ORAL | 3 refills | Status: AC

## 2024-05-31 NOTE — Progress Notes (Signed)
 05/31/2024, 3:33 PM                   Endocrinology follow-up note  Subjective:    Patient ID: Paula Oconnor, female    DOB: 04-Dec-1970, PCP Patient, No Pcp Per   Past Medical History:  Diagnosis Date   Fatty liver    Heel spur, left    Hypercholesteremia    Hyperglycemia    Osteoporosis    Plantar fasciitis of left foot    Schizoaffective disorder (HCC)    Past Surgical History:  Procedure Laterality Date   CHOLECYSTECTOMY     Social History   Socioeconomic History   Marital status: Married    Spouse name: Not on file   Number of children: 0   Years of education: Not on file   Highest education level: Not on file  Occupational History   Not on file  Tobacco Use   Smoking status: Former    Types: Cigarettes   Smokeless tobacco: Never  Vaping Use   Vaping status: Never Used  Substance and Sexual Activity   Alcohol use: Not Currently   Drug use: Never   Sexual activity: Not Currently    Birth control/protection: Condom, Abstinence  Other Topics Concern   Not on file  Social History Narrative   Not on file   Social Drivers of Health   Financial Resource Strain: Low Risk  (02/26/2021)   Overall Financial Resource Strain (CARDIA)    Difficulty of Paying Living Expenses: Not hard at all  Food Insecurity: No Food Insecurity (04/12/2023)   Hunger Vital Sign    Worried About Running Out of Food in the Last Year: Never true    Ran Out of Food in the Last Year: Never true  Transportation Needs: No Transportation Needs (04/12/2023)   PRAPARE - Administrator, Civil Service (Medical): No    Lack of Transportation (Non-Medical): No  Physical Activity: Insufficiently Active (04/12/2023)   Exercise Vital Sign    Days of Exercise per Week: 3 days    Minutes of Exercise per Session: 20 min  Stress: Stress Concern Present (04/12/2023)   [employer] of Occupational Health - Occupational Stress  Questionnaire    Feeling of Stress : Very much  Social Connections: Socially Integrated (04/12/2023)   Social Connection and Isolation Panel    Frequency of Communication with Friends and Family: More than three times a week    Frequency of Social Gatherings with Friends and Family: Three times a week    Attends Religious Services: More than 4 times per year    Active Member of Clubs or Organizations: Yes    Attends Engineer, Structural: More than 4 times per year    Marital Status: Married   Family History  Adopted: Yes  Family history unknown: Yes   Outpatient Encounter Medications as of 05/31/2024  Medication Sig   Cholecalciferol (VITAMIN D ) 50 MCG (2000 UT) CAPS Take 1 capsule (2,000 Units total) by mouth daily.   citalopram (CELEXA) 20 MG tablet Take 20 mg by mouth 2 (two) times daily.   clonazepam (KLONOPIN) 0.125 MG disintegrating tablet Take 0.125 mg by mouth 2 (two) times daily.  clotrimazole -betamethasone  (LOTRISONE ) cream Apply to affected area 2 times daily prn (Patient not taking: Reported on 05/25/2024)   cloZAPine (CLOZARIL) 100 MG tablet Take by mouth. Take 1 tab in the morning, 1 in the afternoon, and 4 before bedtime.   dicyclomine (BENTYL) 10 MG capsule Take 1 capsule (10 mg total) by mouth 4 (four) times daily -  before meals and at bedtime.   ferrous sulfate 325 (65 FE) MG tablet Take 325 mg by mouth daily with breakfast. Take 2 daily   Multiple Vitamin (MULTIVITAMIN) tablet Take 1 tablet by mouth daily.   topiramate (TOPAMAX) 25 MG tablet Take 25 mg by mouth 3 (three) times daily.   vitamin E  200 UNIT capsule Take 2 capsules (400 Units total) by mouth daily.   No facility-administered encounter medications on file as of 05/31/2024.   ALLERGIES: Allergies  Allergen Reactions   Codeine Other (See Comments)    Patient doesn't remember what happened    VACCINATION STATUS:  There is no immunization history on file for this patient.  HPI Paula Oconnor is 53 y.o. female who was seen last visit in consultation for hypoglycemia-determined to be reactive hypoglycemia.  She was never diagnosed with diabetes or prediabetes.  She was subsequently found to have abnormally elevated alkaline phosphatase, hyperlipidemia, and now previsit DEXA scan shows osteoporosis. She is returning for follow-up.  She is on vitamin D  replacement.  She did not have any further hypoglycemia or hyperglycemia.  Her recent point-of-care A1c was 5.5%. Prior to her last visit, labs showing persistently elevated alkaline phosphatase currently at 270.  She did have a negative bone scan previously-negative for any abnormal tracer activity.  She has low GGT at 13.     She is known to have hepatic steatosis. She is following more or less whole food plant-based diet.  Her repeat labs show significant improvement in her alkaline phosphatase to 203.    She is not on any anti- diabetes medication, however has taken Topamax for years.  -Her previous labs ruled out adrenal insufficiency, thyroid  dysfunction.  She returns with 4 pounds of weight loss since last visit.       She is on a list of medications related to her mood disorder including clozapine, clonazepam, Klonopin, risperidone.  She has history of anemia in the past however her most recent CBC showed hemoglobin of 14.2.  She is adopted, does not know any family history of diabetes.  She denies alcohol abuse, she denies any prior exposure to steroids.   Review of Systems Limited as above.    Objective:    BP (!) 114/56   Pulse 96   Ht 5' 3 (1.6 m)   Wt 140 lb 9.6 oz (63.8 kg)   BMI 24.91 kg/m   Wt Readings from Last 3 Encounters:  05/31/24 140 lb 9.6 oz (63.8 kg)  05/25/24 141 lb 6.4 oz (64.1 kg)  01/12/24 139 lb 12.8 oz (63.4 kg)    Physical Exam  Recent Results (from the past 2160 hours)  Protein electrophoresis, serum     Status: None   Collection Time: 04/18/24  8:15 AM  Result Value Ref Range    Albumin ELP 3.9 2.9 - 4.4 g/dL   Alpha 1 0.2 0.0 - 0.4 g/dL   Alpha 2 0.7 0.4 - 1.0 g/dL   Beta 0.9 0.7 - 1.3 g/dL   Gamma Globulin 0.7 0.4 - 1.8 g/dL   M-Spike, % Not Observed Not Observed g/dL   Globulin,  Total 2.5 2.2 - 3.9 g/dL   A/G Ratio 1.6 0.7 - 1.7   Please Note: Comment     Comment: Protein electrophoresis scan will follow via computer, mail, or courier delivery.    Interpretation: Comment     Comment: The SPE pattern appears unremarkable. Evidence of monoclonal protein is not apparent.   Lipid panel     Status: None   Collection Time: 04/18/24  8:15 AM  Result Value Ref Range   Cholesterol, Total 185 100 - 199 mg/dL   Triglycerides 67 0 - 149 mg/dL   HDL 74 >60 mg/dL   VLDL Cholesterol Cal 13 5 - 40 mg/dL   LDL Chol Calc (NIH) 98 0 - 99 mg/dL   Chol/HDL Ratio 2.5 0.0 - 4.4 ratio    Comment:                                   T. Chol/HDL Ratio                                             Men  Women                               1/2 Avg.Risk  3.4    3.3                                   Avg.Risk  5.0    4.4                                2X Avg.Risk  9.6    7.1                                3X Avg.Risk 23.4   11.0   FIB-4 W/REFLEX TO ELF     Status: None   Collection Time: 04/18/24  8:15 AM  Result Value Ref Range   FIB-4 Index 1.83 0.00 - 2.67    Comment:       0.00 - 1.29 Low risk for advanced liver fibrosis       1.30 - 2.67 Indeterminate risk for advanced liver                   fibrosis             >2.67 High risk for advanced fibrosis and                   for the development of other liver                   related events   Magnesium     Status: None   Collection Time: 04/18/24  8:15 AM  Result Value Ref Range   Magnesium 2.3 1.6 - 2.3 mg/dL  Phosphorus     Status: Abnormal   Collection Time: 04/18/24  8:15 AM  Result Value Ref Range   Phosphorus 2.9 (L) 3.0 - 4.3 mg/dL  VITAMIN D  25 Hydroxy (Vit-D Deficiency, Fractures)     Status: None  Collection Time: 04/18/24  8:15 AM  Result Value Ref Range   Vit D, 25-Hydroxy 52.5 30.0 - 100.0 ng/mL    Comment: Vitamin D  deficiency has been defined by the Institute of Medicine and an Endocrine Society practice guideline as a level of serum 25-OH vitamin D  less than 20 ng/mL (1,2). The Endocrine Society went on to further define vitamin D  insufficiency as a level between 21 and 29 ng/mL (2). 1. IOM (Institute of Medicine). 2010. Dietary reference    intakes for calcium and D. [place]: The    [employer]. 2. Holick MF, Binkley Maud, Bischoff-Ferrari HA, et al.    Evaluation, treatment, and prevention of vitamin D     deficiency: an Endocrine Society clinical practice    guideline. JCEM. 2011 Jul; 96(7):1911-30.   Comprehensive metabolic panel with GFR     Status: Abnormal   Collection Time: 04/18/24  8:15 AM  Result Value Ref Range   Glucose 108 (H) 70 - 99 mg/dL   BUN 23 6 - 24 mg/dL   Creatinine, Ser 8.98 (H) 0.57 - 1.00 mg/dL   eGFR 67 >40 fO/fpw/8.26   BUN/Creatinine Ratio 23 9 - 23   Sodium 145 (H) 134 - 144 mmol/L   Potassium 4.0 3.5 - 5.2 mmol/L   Chloride 107 (H) 96 - 106 mmol/L   CO2 24 20 - 29 mmol/L   Calcium 9.6 8.7 - 10.2 mg/dL   Total Protein 6.4 6.0 - 8.5 g/dL   Albumin 4.4 3.8 - 4.9 g/dL   Globulin, Total 2.0 1.5 - 4.5 g/dL   Bilirubin Total 0.3 0.0 - 1.2 mg/dL   Alkaline Phosphatase 203 (H) 49 - 135 IU/L   AST 27 0 - 40 IU/L   ALT 29 0 - 32 IU/L  CBC with Differential/Platelet     Status: Abnormal   Collection Time: 04/18/24  8:15 AM  Result Value Ref Range   WBC 6.4 3.4 - 10.8 x10E3/uL   RBC 4.41 3.77 - 5.28 x10E6/uL   Hemoglobin 14.2 11.1 - 15.9 g/dL   Hematocrit 56.0 65.9 - 46.6 %   MCV 100 (H) 79 - 97 fL   MCH 32.2 26.6 - 33.0 pg   MCHC 32.3 31.5 - 35.7 g/dL   RDW 87.8 88.2 - 84.5 %   Platelets 145 (L) 150 - 450 x10E3/uL   Neutrophils 59 Not Estab. %   Lymphs 30 Not Estab. %   Monocytes 10 Not Estab. %   Eos 0 Not Estab. %    Basos 1 Not Estab. %   Neutrophils Absolute 3.8 1.4 - 7.0 x10E3/uL   Lymphocytes Absolute 2.0 0.7 - 3.1 x10E3/uL   Monocytes Absolute 0.6 0.1 - 0.9 x10E3/uL   EOS (ABSOLUTE) 0.0 0.0 - 0.4 x10E3/uL   Basophils Absolute 0.0 0.0 - 0.2 x10E3/uL   Immature Granulocytes 0 Not Estab. %   Immature Grans (Abs) 0.0 0.0 - 0.1 x10E3/uL  Enhanced Liver Fibrosis (ELF)     Status: None   Collection Time: 04/18/24  8:15 AM  Result Value Ref Range   ELF(TM) Score 8.60 <9.80    Comment: ELF(TM) Score Interpretation: Risk cut-offs to assess the likelihood of progression to cirrhosis and liver-related clinical events within 3.9 years following baseline ELF score (IQR: 14.0-22.4 months)*:                           Lower risk             <  9.80                           Mid risk         9.80 - 11.29                           Higher risk            >11.29 Note: The ELF(TM) Score is a unitless numerical value. Ponce SA, Wong VW, [place] T, et al. Selonsertib for patients with bridging fibrosis or compensated cirrhosis due to NASH: Results from randomized phase III STELLAR trials. J Hepatol. 2020 Jul;73(1):26-39.   Fe+TIBC+Fer     Status: Abnormal   Collection Time: 05/25/24 11:28 AM  Result Value Ref Range   Total Iron Binding Capacity 280 250 - 450 ug/dL   UIBC 815 868 - 574 ug/dL   Iron 96 27 - 840 ug/dL   Iron Saturation 34 15 - 55 %   Ferritin 373 (H) 15 - 150 ng/mL  Celiac Disease Panel     Status: None   Collection Time: 05/25/24 11:28 AM  Result Value Ref Range   Endomysial IgA Negative Negative   t-Transglutaminase (tTG) IgA <2 0 - 3 U/mL    Comment:                               Negative        0 -  3                               Weak Positive   4 - 10                               Positive           >10  Tissue Transglutaminase (tTG) has been identified  as the endomysial antigen.  Studies have demonstr-  ated that endomysial IgA antibodies have over 99%  specificity for gluten  sensitive enteropathy.    Immunoglobulin A, (IgA) QN, Serum 153 87 - 352 mg/dL      Assessment & Plan:   1.  Vitamin D  deficiency--replete  2.  Dyslipidemia, elevated alkaline phosphatase 3.  Vitamin D  deficiency 4.  Impaired glucose tolerance 5.  Osteoporosis-hip T-score -2.5  -She was previously worked up for thyroid  and adrenal function with normal findings.   -Her recent point-of-care A1c was 5.5%.  She previously had  random hyperglycemia -She will not need intervention for glycemic excursion at this time.    -She is  vitamin D  replete at 52.5, advised to continue vitamin D3 2000 units daily.   In light of her nonalcoholic fatty liver disease, she starts to benefit from whole food plant-based diet. This lowered her alkaline phosphatase to 203 from 270. - she acknowledges that there is a room for improvement in her food and drink choices. - Suggestion is made for her to avoid simple carbohydrates  from her diet including Cakes, Sweet Desserts, Ice Cream, Soda (diet and regular), Sweet Tea, Candies, Chips, Cookies, Store Bought Juices, Alcohol , Artificial Sweeteners,  Coffee Creamer, and Sugar-free Products, Lemonade. This will help patient to have more stable blood glucose profile and potentially avoid unintended weight gain.  -In light of her persistent  elevated alk phos, and hyperlipidemia she is status post liver ultrasound which showed hepatic steatosis.  Liver fibrosis score was 1.83-intermediate score, ELF was normal at 8.6.   Her bone scan is not showing evidence of Paget's disease.  However, her recent DEXA scan showed osteoporosis of femoral neck with T-score of -2.5.  She is approached for treatment and she agrees.   I discussed and prescribed Fosamax 70 mg p.o. weekly with plan to repeat bone density in 2 years.  Side effects and precautions discussed with her.   -Screening for celiac disease is negative.    - I advised her  to maintain close follow up with  Patient, No Pcp Per for primary care needs.   I spent  26  minutes in the care of the patient today including review of labs from Thyroid  Function, CMP, and other relevant labs ; imaging/biopsy records (current and previous including abstractions from other facilities); face-to-face time discussing  her lab results and symptoms, medications doses, her options of short and long term treatment based on the latest standards of care / guidelines;   and documenting the encounter.  Paula Oconnor  participated in the discussions, expressed understanding, and voiced agreement with the above plans.  All questions were answered to her satisfaction. she is encouraged to contact clinic should she have any questions or concerns prior to her return visit.    Follow up plan: Return in about 6 months (around 11/28/2024) for F/U with Pre-visit Labs.   Ranny Earl, MD [facility] [address] [place], [place] [address] Phone: [phone]  Fax: [phone]     05/31/2024, 3:33 PM  This note was partially dictated with voice recognition software. Similar sounding words can be transcribed inadequately or may not  be corrected upon review.

## 2024-06-05 LAB — OVA AND PARASITE EXAMINATION

## 2024-06-27 ENCOUNTER — Telehealth: Payer: Self-pay | Admitting: *Deleted

## 2024-06-27 NOTE — Telephone Encounter (Signed)
 LMOVM to return call  TCS asa 2 w/Dr.Rourk Hold Iron x 1 wk No dicyclomine  for 5 days prior  Pt wanted to complete stool studies prior to being scheduled.

## 2024-07-04 ENCOUNTER — Encounter: Payer: Self-pay | Admitting: *Deleted

## 2024-07-04 NOTE — Telephone Encounter (Signed)
LMOVM to return call. Letter mailed

## 2024-07-20 ENCOUNTER — Ambulatory Visit (INDEPENDENT_AMBULATORY_CARE_PROVIDER_SITE_OTHER)

## 2024-07-20 VITALS — BP 108/71 | HR 103 | Temp 97.7°F | Ht 63.0 in | Wt 139.0 lb

## 2024-07-20 DIAGNOSIS — R632 Polyphagia: Secondary | ICD-10-CM

## 2024-07-20 DIAGNOSIS — Z0000 Encounter for general adult medical examination without abnormal findings: Secondary | ICD-10-CM

## 2024-07-20 NOTE — Progress Notes (Signed)
 "  New Patient Office Visit  Subjective    Patient ID: Paula Oconnor, female    DOB: 11-12-1970  Age: 54 y.o. MRN: [id number]  HPI [name] presents to establish care and for annual physical.   The patient comes in today for a wellness visit.  A review of their health history was completed. A review of medications was also completed.  Any needed refills; No  Eating habits: Good, eats a good variety of fruits and vegetables.  Says that she has an increased appetite.  Falls/  MVA accidents in past few months: No  Regular exercise: No, says that she has decreased motivation  Sleep: Okay  Menstrual cycles/sexual history: Post-menopausal, not currently sexually active.   Specialist pt sees on regular basis: gynecology, GI, endocrinology, psychiatry  Regular eye/dental exams: Yes  Preventative health issues were discussed.   Additional concerns: None  Past Medical History:  Diagnosis Date   Fatty liver    Heel spur, left    Hypercholesteremia    Hyperglycemia    Osteoporosis    Plantar fasciitis of left foot    Schizoaffective disorder (HCC)     Past Surgical History:  Procedure Laterality Date   CHOLECYSTECTOMY      Family History  Adopted: Yes  Family history unknown: Yes    Social History   Socioeconomic History   Marital status: Married    Spouse name: Not on file   Number of children: 0   Years of education: Not on file   Highest education level: Not on file  Occupational History   Not on file  Tobacco Use   Smoking status: Former    Current packs/day: 1.00    Average packs/day: 1 pack/day for 40.0 years (40.0 ttl pk-yrs)    Types: Cigarettes    Start date: 21   Smokeless tobacco: Never  Vaping Use   Vaping status: Never Used  Substance and Sexual Activity   Alcohol use: Not Currently   Drug use: Never   Sexual activity: Not Currently    Birth control/protection: Condom, Abstinence  Other Topics Concern   Not on file   Social History Narrative   Not on file   Social Drivers of Health   Tobacco Use: Medium Risk (07/20/2024)   Patient History    Smoking Tobacco Use: Former    Smokeless Tobacco Use: Never    Passive Exposure: Not on Actuary Strain: Not on file  Food Insecurity: No Food Insecurity (04/12/2023)   Hunger Vital Sign    Worried About Running Out of Food in the Last Year: Never true    Ran Out of Food in the Last Year: Never true  Transportation Needs: No Transportation Needs (04/12/2023)   PRAPARE - Administrator, Civil Service (Medical): No    Lack of Transportation (Non-Medical): No  Physical Activity: Insufficiently Active (04/12/2023)   Exercise Vital Sign    Days of Exercise per Week: 3 days    Minutes of Exercise per Session: 20 min  Stress: Stress Concern Present (04/12/2023)   [employer] of Occupational Health - Occupational Stress Questionnaire    Feeling of Stress : Very much  Social Connections: Socially Integrated (04/12/2023)   Social Connection and Isolation Panel    Frequency of Communication with Friends and Family: More than three times a week    Frequency of Social Gatherings with Friends and Family: Three times a week    Attends Religious Services: More than  4 times per year    Active Member of Clubs or Organizations: Yes    Attends Banker Meetings: More than 4 times per year    Marital Status: Married  Catering Manager Violence: Not At Risk (04/12/2023)   Humiliation, Afraid, Rape, and Kick questionnaire    Fear of Current or Ex-Partner: No    Emotionally Abused: No    Physically Abused: No    Sexually Abused: No  Depression (PHQ2-9): High Risk (07/20/2024)   Depression (PHQ2-9)    PHQ-2 Score: 13  Alcohol Screen: Low Risk (04/12/2023)   Alcohol Screen    Last Alcohol Screening Score (AUDIT): 0  Housing: Low Risk (04/12/2023)   Housing    Last Housing Risk Score: 0  Utilities: Not At Risk (04/12/2023)   AHC  Utilities    Threatened with loss of utilities: No  Health Literacy: Not on file    Review of Systems  Objective    BP 108/71   Pulse (!) 103   Temp 97.7 F (36.5 C)   Ht 5' 3 (1.6 m)   Wt 139 lb (63 kg)   SpO2 97%   BMI 24.62 kg/m   Physical Exam     07/20/2024    3:20 PM 04/12/2023    3:49 PM 02/26/2021    1:36 PM  Depression screen PHQ 2/9  Decreased Interest 2 2 2   Down, Depressed, Hopeless 2 1   PHQ - 2 Score 4 3 2   Altered sleeping 2 2 1   Tired, decreased energy 2 2 2   Change in appetite 2 1 0  Feeling bad or failure about yourself  2 3 2   Trouble concentrating 1 1 2   Moving slowly or fidgety/restless 0 1 1  Suicidal thoughts 0 0 0  PHQ-9 Score 13 13  10    Difficult doing work/chores Somewhat difficult       Data saved with a previous flowsheet row definition       07/20/2024    3:20 PM 04/12/2023    3:49 PM 02/26/2021    1:37 PM  GAD 7 : Generalized Anxiety Score  Nervous, Anxious, on Edge 2 3 3   Control/stop worrying 2 2 3   Worry too much - different things 2 2 3   Trouble relaxing 2 3 3   Restless 1 1 3   Easily annoyed or irritable 1 1 3   Afraid - awful might happen 2 3 3   Total GAD 7 Score 12 15 21   Anxiety Difficulty Somewhat difficult      The 10-year ASCVD risk score (Arnett DK, et al., 2019) is: 0.8%   Values used to calculate the score:     Age: 30 years     Clinically relevant sex: Female     Is Non-Hispanic African American: No     Diabetic: No     Tobacco smoker: No     Systolic Blood Pressure: 108 mmHg     Is BP treated: No     HDL Cholesterol: 74 mg/dL     Total Cholesterol: 185 mg/dL   Assessment & Plan:  1. Well woman exam without gynecological exam (Primary) Adult wellness-complete.wellness physical was conducted today. Importance of diet and exercise were discussed in detail.  Importance of stress reduction and healthy living were discussed.  In addition to this a discussion regarding safety was also covered.  We also reviewed  over immunizations and gave recommendations regarding current immunization needed for age.   In addition to this additional areas were  also touched on including: Preventative health exams needed:  Colonoscopy. Plans to schedule this with GI.  Pap smear. Has an appointment with gynecology.   Patient was advised yearly wellness exam  - CMP14+EGFR  2. Increased appetite -Patient reports increased appetite.  Discussed possible etiologies.  Plan to evaluate thyroid  function to rule out underlying thyroid  disorder.  Patient informed and agreeable.  Noted that patient has had frequent laboratory testing in the past. - TSH + free T4   Return in about 1 year (around 07/20/2025) for physical.   Damien KATHEE Pringle, FNP  "

## 2024-07-25 NOTE — Progress Notes (Signed)
 "  New Patient Office Visit  Subjective    Patient ID: Paula Oconnor, female    DOB: 1971/04/09  Age: 54 y.o. MRN: [id number]  HPI Paula Oconnor presents to establish care and for annual physical.   The patient comes in today for a wellness visit.  A review of their health history was completed. A review of medications was also completed.  Any needed refills; No  Eating habits: Good, eats a good variety of fruits and vegetables.  Says that she has an increased appetite.  Falls/  MVA accidents in past few months: No  Regular exercise: No, says that she has decreased motivation  Sleep: Okay  Menstrual cycles/sexual history: Post-menopausal, not currently sexually active.   Specialist pt sees on regular basis: gynecology, GI, endocrinology, psychiatry  Regular eye/dental exams: Yes  Preventative health issues were discussed.   Additional concerns: None  Past Medical History:  Diagnosis Date   Fatty liver    Heel spur, left    Hypercholesteremia    Hyperglycemia    Osteoporosis    Plantar fasciitis of left foot    Schizoaffective disorder (HCC)     Past Surgical History:  Procedure Laterality Date   CHOLECYSTECTOMY      Family History  Adopted: Yes  Family history unknown: Yes    Social History   Socioeconomic History   Marital status: Married    Spouse name: Not on file   Number of children: 0   Years of education: Not on file   Highest education level: Not on file  Occupational History   Not on file  Tobacco Use   Smoking status: Former    Current packs/day: 1.00    Average packs/day: 1 pack/day for 40.0 years (40.0 ttl pk-yrs)    Types: Cigarettes    Start date: 75   Smokeless tobacco: Never  Vaping Use   Vaping status: Never Used  Substance and Sexual Activity   Alcohol use: Not Currently   Drug use: Never   Sexual activity: Not Currently    Birth control/protection: Condom, Abstinence  Other Topics Concern   Not on file   Social History Narrative   Not on file   Social Drivers of Health   Tobacco Use: Medium Risk (07/20/2024)   Patient History    Smoking Tobacco Use: Former    Smokeless Tobacco Use: Never    Passive Exposure: Not on Actuary Strain: Not on file  Food Insecurity: No Food Insecurity (04/12/2023)   Hunger Vital Sign    Worried About Running Out of Food in the Last Year: Never true    Ran Out of Food in the Last Year: Never true  Transportation Needs: No Transportation Needs (04/12/2023)   PRAPARE - Administrator, Civil Service (Medical): No    Lack of Transportation (Non-Medical): No  Physical Activity: Insufficiently Active (04/12/2023)   Exercise Vital Sign    Days of Exercise per Week: 3 days    Minutes of Exercise per Session: 20 min  Stress: Stress Concern Present (04/12/2023)   [employer] of Occupational Health - Occupational Stress Questionnaire    Feeling of Stress : Very much  Social Connections: Socially Integrated (04/12/2023)   Social Connection and Isolation Panel    Frequency of Communication with Friends and Family: More than three times a week    Frequency of Social Gatherings with Friends and Family: Three times a week    Attends Religious Services: More than  4 times per year    Active Member of Clubs or Organizations: Yes    Attends Banker Meetings: More than 4 times per year    Marital Status: Married  Catering Manager Violence: Not At Risk (04/12/2023)   Humiliation, Afraid, Rape, and Kick questionnaire    Fear of Current or Ex-Partner: No    Emotionally Abused: No    Physically Abused: No    Sexually Abused: No  Depression (PHQ2-9): High Risk (07/20/2024)   Depression (PHQ2-9)    PHQ-2 Score: 13  Alcohol Screen: Low Risk (04/12/2023)   Alcohol Screen    Last Alcohol Screening Score (AUDIT): 0  Housing: Low Risk (04/12/2023)   Housing    Last Housing Risk Score: 0  Utilities: Not At Risk (04/12/2023)   [facility]  Utilities    Threatened with loss of utilities: No  Health Literacy: Not on file    Review of Systems  Objective    BP 108/71   Pulse (!) 103   Temp 97.7 F (36.5 C)   Ht 5' 3 (1.6 m)   Wt 139 lb (63 kg)   SpO2 97%   BMI 24.62 kg/m   Physical Exam Vitals and nursing note reviewed.  Constitutional:      General: She is not in acute distress.    Appearance: Normal appearance. She is not ill-appearing.  Cardiovascular:     Rate and Rhythm: Normal rate and regular rhythm.     Heart sounds: Normal heart sounds, S1 normal and S2 normal. No murmur heard. Pulmonary:     Effort: Pulmonary effort is normal. No respiratory distress.     Breath sounds: Normal breath sounds. No wheezing.  Abdominal:     General: There is no distension.     Palpations: Abdomen is soft.     Tenderness: There is no abdominal tenderness.     Hernia: No hernia is present.     Comments: No obvious masses or abnormalities palpated on exam.   Musculoskeletal:     Right lower leg: No edema.     Left lower leg: No edema.  Lymphadenopathy:     Cervical: No cervical adenopathy.  Skin:    General: Skin is warm and dry.  Neurological:     Mental Status: She is alert. Mental status is at baseline.  Psychiatric:        Mood and Affect: Mood normal.        Behavior: Behavior normal.        Thought Content: Thought content normal.        Judgment: Judgment normal.       07/20/2024    3:20 PM 04/12/2023    3:49 PM 02/26/2021    1:36 PM  Depression screen PHQ 2/9  Decreased Interest 2 2 2   Down, Depressed, Hopeless 2 1   PHQ - 2 Score 4 3 2   Altered sleeping 2 2 1   Tired, decreased energy 2 2 2   Change in appetite 2 1 0  Feeling bad or failure about yourself  2 3 2   Trouble concentrating 1 1 2   Moving slowly or fidgety/restless 0 1 1  Suicidal thoughts 0 0 0  PHQ-9 Score 13 13  10    Difficult doing work/chores Somewhat difficult       Data saved with a previous flowsheet row definition        07/20/2024    3:20 PM 04/12/2023    3:49 PM 02/26/2021    1:37 PM  GAD 7 : Generalized Anxiety Score  Nervous, Anxious, on Edge 2 3 3   Control/stop worrying 2 2 3   Worry too much - different things 2 2 3   Trouble relaxing 2 3 3   Restless 1 1 3   Easily annoyed or irritable 1 1 3   Afraid - awful might happen 2 3 3   Total GAD 7 Score 12 15 21   Anxiety Difficulty Somewhat difficult      The 10-year ASCVD risk score (Arnett DK, et al., 2019) is: 0.8%   Values used to calculate the score:     Age: 61 years     Clinically relevant sex: Female     Is Non-Hispanic African American: No     Diabetic: No     Tobacco smoker: No     Systolic Blood Pressure: 108 mmHg     Is BP treated: No     HDL Cholesterol: 74 mg/dL     Total Cholesterol: 185 mg/dL   Assessment & Plan:  1. Well woman exam without gynecological exam (Primary) Adult wellness-complete.wellness physical was conducted today. Importance of diet and exercise were discussed in detail.  Importance of stress reduction and healthy living were discussed.  In addition to this a discussion regarding safety was also covered.  We also reviewed over immunizations and gave recommendations regarding current immunization needed for age.   In addition to this additional areas were also touched on including: Preventative health exams needed:  Colonoscopy. Plans to schedule this with GI.  Pap smear. Has an appointment with gynecology.   Patient was advised yearly wellness exam  - CMP14+EGFR  2. Increased appetite -Patient reports increased appetite.  Discussed possible etiologies.  Plan to evaluate thyroid  function to rule out underlying thyroid  disorder.  Patient informed and agreeable.  Noted that patient has had frequent laboratory testing in the past. - TSH + free T4   Return in about 1 year (around 07/20/2025) for physical.   Damien KATHEE Pringle, FNP  "

## 2024-07-26 ENCOUNTER — Other Ambulatory Visit (HOSPITAL_COMMUNITY)
Admission: RE | Admit: 2024-07-26 | Discharge: 2024-07-26 | Disposition: A | Discharge status: Home / Self Care | Source: Ambulatory Visit | Attending: Adult Health | Admitting: Adult Health

## 2024-07-26 ENCOUNTER — Ambulatory Visit (INDEPENDENT_AMBULATORY_CARE_PROVIDER_SITE_OTHER): Admitting: Adult Health

## 2024-07-26 ENCOUNTER — Encounter: Payer: Self-pay | Admitting: Adult Health

## 2024-07-26 VITALS — BP 102/70 | HR 96 | Ht 63.0 in | Wt 142.0 lb

## 2024-07-26 DIAGNOSIS — Z1331 Encounter for screening for depression: Secondary | ICD-10-CM

## 2024-07-26 DIAGNOSIS — Z01419 Encounter for gynecological examination (general) (routine) without abnormal findings: Secondary | ICD-10-CM | POA: Diagnosis present

## 2024-07-26 DIAGNOSIS — Z124 Encounter for screening for malignant neoplasm of cervix: Secondary | ICD-10-CM

## 2024-07-26 DIAGNOSIS — Z780 Asymptomatic menopausal state: Secondary | ICD-10-CM | POA: Diagnosis not present

## 2024-07-26 DIAGNOSIS — Z1151 Encounter for screening for human papillomavirus (HPV): Secondary | ICD-10-CM | POA: Insufficient documentation

## 2024-07-26 NOTE — Progress Notes (Signed)
 Patient ID: Paula Oconnor, female   DOB: 01-May-1971, 54 y.o.   MRN: [id number] History of Present Illness:  Paula Oconnor is a 54 year old white female, married, PM in for a well woman gyn exam and pap.  PCP is Damien Pringle NP  Current Medications, Allergies, Past Medical History, Past Surgical History, Family History and Social History were reviewed in [employer] record.     Review of Systems: Patient denies any headaches, hearing loss, fatigue, blurred vision, shortness of breath, chest pain, abdominal pain, problems with bowel movements, urination, or intercourse.(Not active) No joint pain or mood swings.  Denies any vaginal bleeding Wears glasses   Physical Exam:BP 102/70 (BP Location: Right Arm, Patient Position: Sitting, Cuff Size: Normal)   Pulse 96   Ht 5' 3 (1.6 m)   Wt 142 lb (64.4 kg)   LMP 06/12/2022   BMI 25.15 kg/m   General:  Well developed, well nourished, no acute distress Skin:  Warm and dry Neck:  Midline trachea, normal thyroid , good ROM, no lymphadenopathy Lungs; Clear to auscultation bilaterally Breast:  No dominant palpable mass, retraction, or nipple discharge Cardiovascular: Regular rate and rhythm Abdomen:  Soft, non tender, no hepatosplenomegaly Pelvic:  External genitalia is normal in appearance, no lesions.  The vagina is pale. Urethra has no lesions or masses. The cervix is smooth with stenotic os, pap with HR HPV genotyping performed.  Uterus is felt to be normal size, shape, and contour.  No adnexal masses or tenderness noted.Bladder is non tender, no masses felt. Rectal: Deferred  Extremities/musculoskeletal:  No swelling or varicosities noted, no clubbing or cyanosis Psych:  No mood changes, alert and cooperative,seems happy AA is 0 Fall risk is low    07/26/2024    1:25 PM 07/20/2024    3:20 PM 04/12/2023    3:49 PM  Depression screen PHQ 2/9  Decreased Interest 2 2 2   Down, Depressed, Hopeless 2 2 1   PHQ - 2 Score 4 4 3    Altered sleeping 2 2 2   Tired, decreased energy 2 2 2   Change in appetite 2 2 1   Feeling bad or failure about yourself  2 2 3   Trouble concentrating 1 1 1   Moving slowly or fidgety/restless 1 0 1  Suicidal thoughts 0 0 0  PHQ-9 Score 14 13 13    Difficult doing work/chores  Somewhat difficult      Data saved with a previous flowsheet row definition   She is on meds and see Grayce Pander     07/26/2024    1:25 PM 07/20/2024    3:20 PM 04/12/2023    3:49 PM 02/26/2021    1:37 PM  GAD 7 : Generalized Anxiety Score  Nervous, Anxious, on Edge 3 2 3 3   Control/stop worrying 3 2 2 3   Worry too much - different things 3 2 2 3   Trouble relaxing 3 2 3 3   Restless 1 1 1 3   Easily annoyed or irritable 1 1 1 3   Afraid - awful might happen 3 2 3 3   Total GAD 7 Score 17 12 15 21   Anxiety Difficulty  Somewhat difficult      Upstream - 07/26/24 1331       Pregnancy Intention Screening   Does the patient want to become pregnant in the next year? N/A    Does the patient's partner want to become pregnant in the next year? N/A    Would the patient like to discuss contraceptive options  today? N/A      Contraception Wrap Up   Current Method Abstinence;Post-Menopause    End Method Abstinence;Post-Menopause    Contraception Counseling Provided No           Examination chaperoned by Clarita Salt LPN  Impression and plan: 1. Encounter for gynecological examination with Papanicolaou smear of cervix (Primary) Pap in 2029 GYN physical in 2 years Labs with PCP Mammogram was negative 03/01/23 Get colonoscopy - Cytology - PAP( [place])  2. Postmenopause Denies any vaginal bleeding

## 2024-07-27 ENCOUNTER — Ambulatory Visit: Payer: Self-pay

## 2024-07-27 LAB — CMP14+EGFR
ALT: 20 IU/L (ref 0–32)
AST: 18 IU/L (ref 0–40)
Albumin: 4.4 g/dL (ref 3.8–4.9)
Alkaline Phosphatase: 187 IU/L — ABNORMAL HIGH (ref 49–135)
BUN/Creatinine Ratio: 27 — ABNORMAL HIGH (ref 9–23)
BUN: 24 mg/dL (ref 6–24)
Bilirubin Total: <0.2 mg/dL (ref 0.0–1.2)
CO2: 22 mmol/L (ref 20–29)
Calcium: 9.2 mg/dL (ref 8.7–10.2)
Chloride: 108 mmol/L — ABNORMAL HIGH (ref 96–106)
Creatinine, Ser: 0.88 mg/dL (ref 0.57–1.00)
Globulin, Total: 1.8 g/dL (ref 1.5–4.5)
Glucose: 90 mg/dL (ref 70–99)
Potassium: 4 mmol/L (ref 3.5–5.2)
Sodium: 143 mmol/L (ref 134–144)
Total Protein: 6.2 g/dL (ref 6.0–8.5)
eGFR: 79 mL/min/1.73

## 2024-07-27 LAB — TSH+FREE T4
Free T4: 0.88 ng/dL (ref 0.82–1.77)
TSH: 1.12 u[IU]/mL (ref 0.450–4.500)

## 2024-07-27 NOTE — Progress Notes (Signed)
 Called pt and went over labs together - verbalized understanding

## 2024-07-27 NOTE — Progress Notes (Signed)
 Please call the patient to discuss labs.  Alkaline phosphatase was elevated.  She does have a history of this.  Not concerned because she has followed up with Dr. Lenis for this, and has been worked up previously for this.  He recommended repeat in bone density in 2 years.  Recommend her to continue taking vitamin D  for supplementation.  Chloride was mildly elevated but not significant.  Has been elevated in the past.  Kidney function was okay.  All other labs normal.  Thanks, Damien Pringle, FNP

## 2024-07-28 LAB — CYTOLOGY - PAP
Comment: NEGATIVE
Diagnosis: NEGATIVE
High risk HPV: NEGATIVE

## 2024-07-31 ENCOUNTER — Ambulatory Visit: Payer: Self-pay | Admitting: Adult Health

## 2024-07-31 NOTE — Telephone Encounter (Signed)
-----   Message from Delon Lewis, NP sent at 07/31/2024  9:37 AM EST ----- Let her know pap was negative

## 2024-07-31 NOTE — Telephone Encounter (Signed)
 Left message @ 10:11 am, letting pt know pap was negative. Next pap due in 3 years. Advised to call if she has any questions. JSY

## 2024-09-07 ENCOUNTER — Ambulatory Visit

## 2024-11-29 ENCOUNTER — Ambulatory Visit: Admitting: "Endocrinology

## 2025-07-23 ENCOUNTER — Encounter
